# Patient Record
Sex: Male | Born: 1970
Health system: Southern US, Community
[De-identification: ages and names within clinical notes are randomized; demographics above are authoritative.]

## PROBLEM LIST (undated history)

## (undated) DIAGNOSIS — M169 Osteoarthritis of hip, unspecified: Secondary | ICD-10-CM

## (undated) DIAGNOSIS — M109 Gout, unspecified: Secondary | ICD-10-CM

## (undated) DIAGNOSIS — I10 Essential (primary) hypertension: Secondary | ICD-10-CM

## (undated) HISTORY — PX: JOINT REPLACEMENT: SHX530

## (undated) HISTORY — PX: TOTAL HIP ARTHROPLASTY: SHX124

---

## 1999-06-24 ENCOUNTER — Emergency Department (HOSPITAL_COMMUNITY): Admission: EM | Admit: 1999-06-24 | Discharge: 1999-06-24 | Payer: Self-pay | Admitting: Emergency Medicine

## 2000-11-08 ENCOUNTER — Emergency Department (HOSPITAL_COMMUNITY): Admission: EM | Admit: 2000-11-08 | Discharge: 2000-11-08 | Payer: Self-pay | Admitting: Emergency Medicine

## 2007-12-02 ENCOUNTER — Inpatient Hospital Stay (HOSPITAL_COMMUNITY): Admission: RE | Admit: 2007-12-02 | Discharge: 2007-12-06 | Payer: Self-pay | Admitting: Orthopedic Surgery

## 2007-12-03 ENCOUNTER — Encounter (INDEPENDENT_AMBULATORY_CARE_PROVIDER_SITE_OTHER): Payer: Self-pay | Admitting: Orthopedic Surgery

## 2007-12-03 ENCOUNTER — Ambulatory Visit: Payer: Self-pay | Admitting: Cardiology

## 2009-01-07 IMAGING — CR DG CHEST 1V PORT
1 series · 1 of 1 positions shown · non-contrast
Comparison: 12/04/07.

CLINICAL DATA: 36-year-old male with right hip aseptic necrosis.  
 PORTABLE CHEST ? 1 VIEW:

[view not recorded]
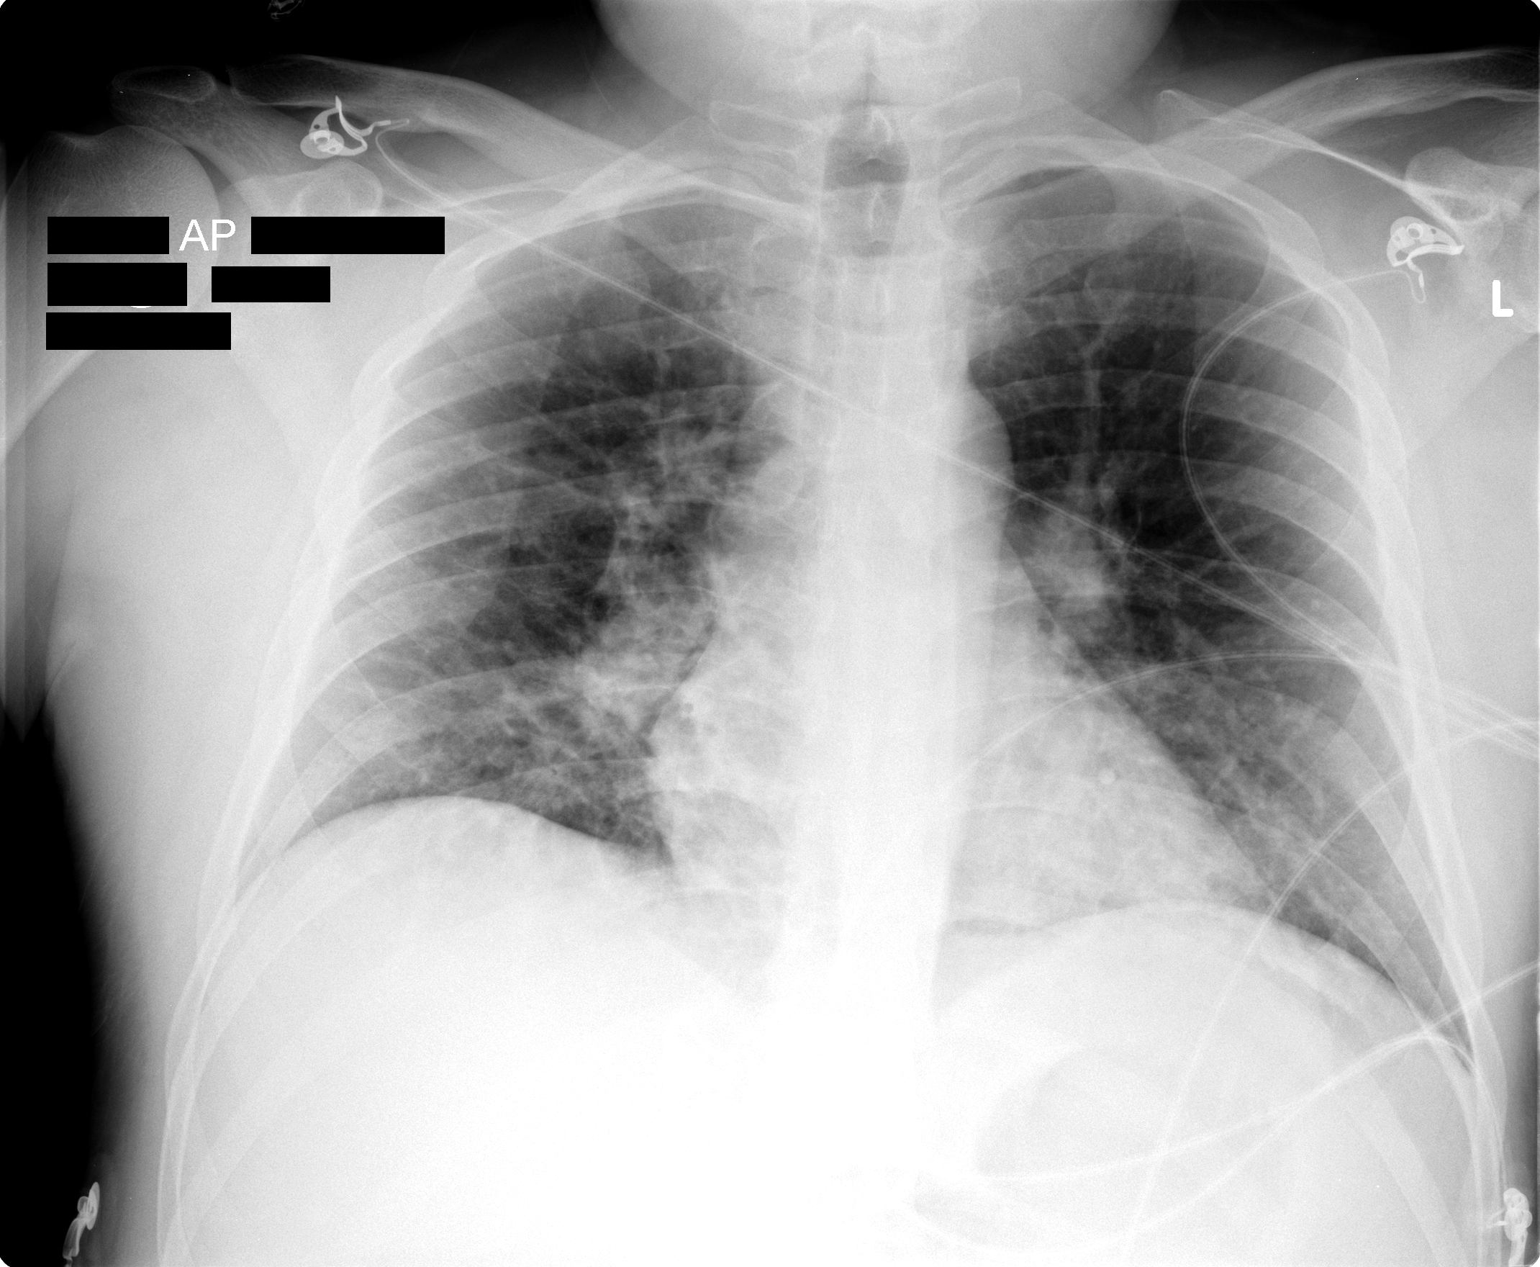

[1 of 1 positions shown; findings below may reference images not displayed]

FINDINGS: Perihilar and bibasilar airspace disease versus atelectasis has slightly increased.  No large effusion or pneumothorax.  The trachea is midline.  Normal heart size.
IMPRESSION: Increased perihilar and bibasilar atelectasis versus airspace disease.

## 2011-03-18 NOTE — Consult Note (Signed)
NAMEDELOS, KLICH                ACCOUNT NO.:  [id number]   MEDICAL RECORD NO.:  [id number]          PATIENT TYPE:  INP   LOCATION:  1608                         FACILITY:  [facility]   PHYSICIAN:  Beckey Rutter, MD  DATE OF BIRTH:  Aug 26, 1971   DATE OF CONSULTATION:  12/03/2007  DATE OF DISCHARGE:                                 CONSULTATION   REASON FOR CONSULTATION:  Tachycardia with difficulty breathing.   HISTORY OF PRESENT ILLNESS:  Mr. Solanki is a 40 year old , Caucasian  male with past medical history significant for cocaine abuse, ethanol  dependency and history of aseptic necrosis of bilateral hips, status  post right total hip replacement.  The patient had the surgery yesterday  with no complications.  This morning, as per the nurse's report, the  patient became tachycardic and they noticed that he dropped his oxygen  saturation to below 80.  That is when the rapid response team was called  and the patient was put on oxygen and then was stabilized by Ativan.  The patient himself could not give a good account in regards to what  happened this morning, but he stated that he was drinking hard liquor on  a daily basis with few exceptions and the last time he drank was Sunday.  That is three days before the surgery.  The patient drank one pint of  hard liquor.  He also admits to doing/snorting cocaine on an on-and-off  basis.  Otherwise, he denied injectable drug abuse or any other kind of  recreational drugs.   PAST MEDICAL HISTORY:  Not significant, other than the alcohol abuse and  the ethanol abuse.   SOCIAL HISTORY:  Patient lives with his wife.  He works as an  Personnel officer.   MEDICATIONS:  He has not taken any medication.   PAST SURGICAL HISTORY:  Patient had right AML total hip replacement  yesterday, January 29.   REVIEW OF SYSTEMS:  Twelve-point review of systems is not contributory.   EXAMINATION:  His pulse is 141, respiratory rate is 20, saturation on O2  documented earlier was 88.  Blood pressure is 105/64.  ON EXAM:  Head atraumatic, normocephalic.  EYES:  PERRL.  MOUTH:  Moist, no ulcer.  NECK:  Supple, no JVD.  PRECORDIUM EXAMINATION:  Fast heart rate with no added sounds.  LUNGS:  Bilateral fair air entry.  ABDOMEN:  Soft, nontender.  Bowel sounds present.  EXTREMITIES:  No obvious edema.  Patient is status post right-hip  surgery.  SKIN:  Patient has multiple tattoos on his skin.  NEUROLOGICALLY:  Patient is alert and oriented times three, moving all  his extremities spontaneously.   LABORATORY AND X-RAYS:  Cardiac panel showing troponin of 0.07, CK-MB 3  with relative index 0.9.  Sodium is 131, potassium 3.6, chloride is 98,  bicarb is 28, glucose is 123, BUN is 6, creatinine 1.08.  White blood  count 7.3, hemoglobin is 11.2, hematocrit is 31.5 and platelet count is  226.  Urine for drug screen, done yesterday before surgery, showing  positive for cocaine.   CT angiogram  ordered is still pending.  EKG showing sinus tachycardia  with ventricular rate of 139, normal axis, no significant ST/T-wave  change.   ASSESSMENT AND PLAN:  Patient is a 40 year old, now with some  irritability, likely alcohol withdrawal.  I am not sure of the time-  frame, because it seems like more than 36 hours now, but clinically, the  patient is most likely having withdrawal from alcohol symptoms with  impending or even delirium tremens at this time.  The patient could have  pulmonary embolism or acute coronary syndrome, but at this time, it  seems to be unlikely.   SUGGESTION:  1. We would probably need to start him on Ativan protocol and keep      monitoring him with CIWA.  2. Would rule out PE with CT angiogram, which was done.  The result is      pending, though I doubt it will yield anything at this time.  We      would follow through with the result of the CT angiogram.  3. I would also rule him out for acute coronary syndrome or any       cardiac activity with three cardiac enzymes and serial EKGs,      especially with the fact that the patient had history of chronic      cocaine abuse and we would be a little concerned that he could have      previous cardiac activity/attack previously.  I would request 2D      echo while he is in the hospital.   Thank you very much for the consultation.  [facility] Team H will follow  through with you.      Beckey Rutter, MD  Electronically Signed     EME/MEDQ  D:  12/03/2007  T:  12/03/2007  Job:  [id number]

## 2011-03-18 NOTE — Op Note (Signed)
John Singh, John Singh                ACCOUNT NO.:  [id number]   MEDICAL RECORD NO.:  [id number]          PATIENT TYPE:  INP   LOCATION:  0004                         FACILITY:  [facility]   PHYSICIAN:  John L. Rendall, M.D.  DATE OF BIRTH:  05-17-1971   DATE OF PROCEDURE:  DATE OF DISCHARGE:                               OPERATIVE REPORT   PREOPERATIVE DIAGNOSIS:  Aseptic necrosis, right hip.   POSTOPERATIVE DIAGNOSIS:  Aseptic necrosis, right hip.   SURGICAL PROCEDURES:  Right AML total hip replacement.   SURGEON:  Dr. Priscille Kluver.   ASSISTANT:  Arnoldo Morale, PA-C.   ANESTHESIA:  General.   PATHOLOGY:  The patient is a 40 year old, white male, weight of 93 kg  who admits to former heavy drinking over a long period of time. He has  on MRI aseptic necrosis of both femoral heads with collapse on the  right. This was confirmed at the time of surgery. He did not want any  type of vascularized graft and simply wanted surgical reconstruction of  the hip.   PROCEDURE:  Under general anesthesia, the patient is placed in the left  lateral decubitus position. The hip prepared with DuraPrep and draped as  a sterile field.  A posterior approach is made splitting the IT band in  the line of its fibers and inserting a short Charnley retractor.  The  hip is internally rotated and the short external rotators are released  with electrocautery from bone and the capsule is opened as well.  Electrocautery is used for multiple bleeding vessels. The hip capsule is  then opened in a T-shaped manner after teasing the short external  rotators off of it with a Cobb elevator.  The hip was then dislocated,  the superior femoral neck is exposed.  The IM initiator and canal finder  are used, the femoral neck is osteotomized and the canal is reamed up to  14-1/2 mm. This gives excellent chatter fit.  Rasping is then done 12.5  narrow, 13.5 narrow and 15 narrow which gives the best fit and calcar  reaming is then  done. With the femoral side completed the acetabulum was  then exposed as the OR team switches sides of the table. The cobra  retractors were placed inferiorly.  The hip capsule was teased off of  the labrum and two wing retractors were paid placed on the posterior  ilium above the acetabulum. With these in place,  the labrum is excised,  ligamentum teres is excised.  Progressive reaming is then done, 47, 49,  51, 53 and 55 mm which gives a good fit and circumferentially fills the  acetabulum.  Trial seating of a 44 bottoms out nicely, 46 has a  relatively snug fit in the acetabulum. With excellent bleeding bone all  about, the pinnacle 56 mm 300 series cup was inserted and appropriately  seated bottoming out nicely in the acetabulum.  Trials of a metal-on-  metal acetabular unit are then placed and components are then assembled  off a rasp for a 1-1/2 mm neck, 5 mm neck and 8 mm neck with  a 40 mm hip  ball.  This gave excellent fit, alignment and stability through normal  range of motion and leg lengths appeared to be equal as the patient was  in the lateral lying position compared to preop. With this all checked,  permanent components were obtained, the apex hole eliminator is  inserted.  The metal liner is placed in the acetabulum bottoming out  nicely. The 15 mm narrow AML stem is inserted and after trial one more  time with the +8 hip ball 40 mm excellent stability noted in internal  and external rotation, flexion extension and a shuck test of about a  centimeter. A permanent component was then attached to the femoral neck  and the hip was then reduced. Irrigation is done at each step of  insertion of permanent components. 2 grams of Kefzol were received  preop. As the hip was now in place, the remainder of the hip capsule was  reapproximated with #1 Tycron, the short external rotators were  reattached with #1 Tycron, the IT band closed with #1 Tycron, subcu with  #1 Vicryl, 2-0  Vicryl and skin clips.  Total operative time  approximately an hour.  The patient's blood loss under 300 mL. He  returned to recovery in good condition.      John L. Rendall, M.D.  Electronically Signed     JLR/MEDQ  D:  12/02/2007  T:  12/02/2007  Job:  [id number]

## 2011-03-21 NOTE — Discharge Summary (Signed)
John Singh, John Singh                ACCOUNT NO.:  [id number]   MEDICAL RECORD NO.:  [id number]          PATIENT TYPE:  INP   LOCATION:  1614                         FACILITY:  [facility]   PHYSICIAN:  John L. Rendall, M.D.  DATE OF BIRTH:  04-30-1971   DATE OF ADMISSION:  12/02/2007  DATE OF DISCHARGE:  12/06/2007                               DISCHARGE SUMMARY   ADMISSION DIAGNOSES:  1. Avascular necrosis, right hip.  2. History of alcohol abuse.  3. Episodic elevated blood pressure.  4. History of gout.   DISCHARGE DIAGNOSES:  1. Avascular necrosis right hip status post right total hip      arthroplasty.  2. Episodic drug use parentheses cocaine.  3. Acute alcohol withdrawal.  4. Possible bibasilar pneumonia.  5. Acute blood loss anemia secondary to surgery.  6. Elevated blood pressure.  7. History of gout.   SURGICAL PROCEDURES:  On December 02, 2007, John Singh underwent a right  total hip arthroplasty by Dr. Jonny Ruiz L.  Rendall assisted by Arnoldo Morale,  PA-C.  He had a pedicle 300 series acetabular cup size 56 mm placed with  an apex hole eliminator and a pedicle metal insert 40 mm inner diameter,  56 mm outer diameter.  An AML small stature size 15 femoral stem with an  articulate metal femoral head 40 mm +8.5 offset 12/14 taper.   COMPLICATIONS:  None.   CONSULTANT:  1. Medical consult was obtained December 03, 2007.  2. Physical therapy consult December 03, 2007.  3. Occupational therapy consult December 04, 2007  4. Case management [facility] consult obtained December 06, 2007.  5. Clinical social work consult was obtained December 06, 2007.   HISTORY OF PRESENT ILLNESS:  This 40 year old white male patient  presented to Dr. Priscille Kluver with a 93-month history of gradual onset  progressive right hip pain.  No history of any injury or surgery to the  hip but a history of heavy alcohol use.  He was found on x-ray to have  findings consistent with avascular necrosis of  the hip and was having  constant dull, throbbing sensation over the latter trochanter without  radiation.  Nothing was really aggravating or alleviating the pain, but  the hip did pop, grinding and difficulty putting on his socks and shoes.  He has failed conservative treatment and x-rays show avascular necrosis  of the hip.  Because of this, he is presenting for a right hip  replacement.   HOSPITAL COURSE:  John Singh has tolerated his surgical procedure well  without any immediate postoperative complications.  He was transferred  to the orthopedic floor.  On the morning of postop day #1, he became  tachycardiac and a bit on the hypertensive side.  A review of the chart  showed that a urine drug screen ordered preoperatively did show positive  for cocaine.  He did have a known history of heavy alcohol use in the  past.  Stat CT scan was obtained to rule out PE, echocardiogram, medical  consult and serial cardiac enzymes.  He  was continued on O2 and  transferred to step down unit.   On postop day #2, T-max was 101.6, heart rate 120, hemoglobin 9.4,  hematocrit 27.2.  Chest x-ray had shown no signs of PE but it had shown  signs of possible bibasilar pneumonia and possibly aspiration.  He was  started on IV antibiotics and continued on aggressive pulmonary toilet.   On postop day #3, he was feeling a bit better.  He had been started on  that day and protocol for withdrawal symptoms and first day after  surgery and he was doing well with that.  He did have some difficulty  with shakiness of hands.  His hemoglobin had dropped to 7.9 and 22.4.  He was transfused with one unit of packed red blood cells.  He was  continued on therapy per protocol.  He was able to be discharged off the  telemetry floor, weaned off his oxygen and actually was given 2 units of  blood cells that day.   On postop day #4, he was feeling better and tolerating diet well.  Pain  was controlled.  Hemoglobin 10,  hematocrit 28.4.  Pulse had gone down to  95, BP 93/59.  Leg was neurovascularly intact.  It was felt after being  seen by medicine that day that he would be ready for discharge home.  He  was to be switched to p.o. antibiotics by the medical doctors and was  discharged home later that day.   Social work had seen him and did recommend alcohol and drugs therapy.  Rehab therapy.   MEDICATIONS:  He may resume his home medication as follows:  1. Indocin 25 mg, he is not to take at this time.  2. Celebrex 200 mg one tablet p.o. b.i.d..   Additional medications at this time include:  1. Arixtra 2.5 mg subcu q. 8 p.m. last dose on February 7.  He is to      resume his aspirin on February 8.  2. Percocet 5/325 1-2 tablets p.o. q.4 h p.r.n. for pain.  He is to      take no alcohol or drugs with the above-mentioned medicines.  3. Ativan 2 mg p.o. q.8 h p.r.n. anxiety.  4. Ambien 10 mg p.o. q.h.s. p.r.n.  5. Thiamine 100 mg p.o. q.a.m.  6. Folic acid 1 mg p.o. q.a.m.  7. Multivitamin one tablet p.o. q.a.m.Marland Kitchen   DISCHARGE INSTRUCTIONS:  1. Diet:  He is to resume his regular prehospitalization diet.  2. Activity:  He is to be out of bed weightbearing as tolerated on the      right leg with use of walker.  He is to not lift or drive for 6      weeks and he is to increase activity slowly.  Please see the white      total hip discharge sheet for further activity instructions.  He      may shower on Wednesday.  3. Wound care:  He he is to keep the dressing on until Thursday and      then change daily with 4x4s and tape.  Please see the white total      hip discharge sheet for further wound care instructions.  4. Follow-up:  He is to follow up with Dr. Priscille Kluver in our office next      Thursday or Friday.  Needs to call [phone] for that appointment.      He is to follow up about February 10.  LABORATORY DATA:  Hemoglobin/hematocrit have ranged from 15.4 and 44.4  on the January 27, to 7.9 and 22.4  on February 1, to 10 and 28.4 on  February 2.  White count ranged from 5 on January 27 to 11.9 on January  31, to 7.2 on February 2.  Platelets 296 on January 27 to 181 on  February 1.   Sodium dropped to a low of 131 on January 30.  Glucose ranged from 104  on January 27  to 150 on February 1 to 122 on February 2.  BUN and  creatinine went to a low of 4 and 1.03 on February 2.  Calcium ranged  from 9.5 on January 27 to 7.9 on January  30 to 8.3 on February 2.  His  CK on January 30 was 340 with troponin 0.07.  CK then went to a high of  419 on January 30 with a CK-MB of 4.2 with troponin dropped down to the  normal range.  CK then went 248 on February 2.  Urine drug screen done  on January 29 was positive for cocaine, negative for all other classes  tested.   Chest CT with and without contrast done on January 30 showed no evidence  of PE but bibasilar air space opacities suggesting aspiration  pneumonitis could include infection.  There is a moderate amount of  fluid in the stomach and fluid in the esophagus.  Chest x-ray done on  January  31 showed medial bilateral basilar airspace disease suggesting  aspiration without significant interval change.  Chest x-ray on the  February 1 showed increased perihilar and bibasilar atelectasis versus  airspace disease.  All other laboratory studies were within normal  limits.      Legrand Pitts Duffy, P.A.      John L. Rendall, M.D.  Electronically Signed    KED/MEDQ  D:  12/17/2007  T:  12/19/2007  Job:  [id number]   cc:   Jonny Ruiz L. Rendall, M.D.  Fax: [phone]   Gloriajean Dell. Andrey Campanile, M.D.  Fax: [phone]

## 2011-07-25 LAB — DIFFERENTIAL
Eosinophils Relative: 2
Lymphocytes Relative: 32
Lymphs Abs: 1.6
Monocytes Relative: 11

## 2011-07-25 LAB — CBC
HCT: 31.5 — ABNORMAL LOW
HCT: 28.4 — ABNORMAL LOW
Hemoglobin: 10 — ABNORMAL LOW
MCHC: 34.1
MCHC: 34.8
MCHC: 35.1
MCV: 90.1
MCV: 90.5
MCV: 90.9
MCV: 89
Platelets: 296
Platelets: 181
RBC: 3.04 — ABNORMAL LOW
RBC: 3.49 — ABNORMAL LOW
RBC: 2.47 — ABNORMAL LOW
RDW: 12.9
RDW: 13.7
RDW: 14.1
WBC: 7.3
WBC: 8.5

## 2011-07-25 LAB — BASIC METABOLIC PANEL
BUN: 6
CO2: 28
Calcium: 8.1 — ABNORMAL LOW
Calcium: 8.3 — ABNORMAL LOW
Chloride: 98
GFR calc Af Amer: >60
GFR calc non Af Amer: >60
GFR calc non Af Amer: >60
GFR calc non Af Amer: >60
Glucose, Bld: 134 — ABNORMAL HIGH
Glucose, Bld: 122 — ABNORMAL HIGH
Potassium: 3.9
Potassium: 3.9
Sodium: 131 — ABNORMAL LOW
Sodium: 135
Sodium: 136
Sodium: 139

## 2011-07-25 LAB — CARDIAC PANEL(CRET KIN+CKTOT+MB+TROPI)
CK, MB: 2
CK, MB: 2.2
CK, MB: 3
CK, MB: 3.8
CK, MB: 4.2 — ABNORMAL HIGH
CK, MB: 1.2
CK, MB: 1.5
Relative Index: 0.7
Relative Index: 1
Relative Index: 0.5
Relative Index: 0.6
Total CK: 301 — ABNORMAL HIGH
Total CK: 309 — ABNORMAL HIGH
Total CK: 340 — ABNORMAL HIGH
Total CK: 419 — ABNORMAL HIGH
Total CK: 206

## 2011-07-25 LAB — CROSSMATCH
ABO/RH(D): O POS
ABO/RH(D): O POS
Antibody Screen: NEGATIVE

## 2011-07-25 LAB — RAPID URINE DRUG SCREEN, HOSP PERFORMED: Cocaine: POSITIVE — AB

## 2011-07-25 LAB — COMPREHENSIVE METABOLIC PANEL
ALT: 18
AST: 22
Albumin: 3.8
Calcium: 9.5
Potassium: 4
Sodium: 139
Total Protein: 7.2

## 2011-07-25 LAB — URINALYSIS, ROUTINE W REFLEX MICROSCOPIC
Bilirubin Urine: NEGATIVE
Hgb urine dipstick: NEGATIVE
Nitrite: NEGATIVE
Protein, ur: NEGATIVE
Urobilinogen, UA: 0.2

## 2011-07-25 LAB — HEMOGLOBIN AND HEMATOCRIT, BLOOD: Hemoglobin: 8 — ABNORMAL LOW

## 2011-07-25 LAB — PROTIME-INR: Prothrombin Time: 12.8

## 2013-08-29 ENCOUNTER — Ambulatory Visit (INDEPENDENT_AMBULATORY_CARE_PROVIDER_SITE_OTHER): Payer: Medicare HMO | Admitting: Psychology

## 2013-08-29 DIAGNOSIS — F1010 Alcohol abuse, uncomplicated: Secondary | ICD-10-CM

## 2014-02-14 ENCOUNTER — Encounter (HOSPITAL_BASED_OUTPATIENT_CLINIC_OR_DEPARTMENT_OTHER): Payer: Self-pay | Admitting: Emergency Medicine

## 2014-02-14 ENCOUNTER — Emergency Department (HOSPITAL_BASED_OUTPATIENT_CLINIC_OR_DEPARTMENT_OTHER)
Admission: EM | Admit: 2014-02-14 | Discharge: 2014-02-14 | Disposition: A | Discharge status: Home / Self Care | Payer: Medicare HMO | Attending: Emergency Medicine | Admitting: Emergency Medicine

## 2014-02-14 ENCOUNTER — Emergency Department (HOSPITAL_BASED_OUTPATIENT_CLINIC_OR_DEPARTMENT_OTHER): Payer: Medicare HMO

## 2014-02-14 DIAGNOSIS — M25469 Effusion, unspecified knee: Secondary | ICD-10-CM | POA: Insufficient documentation

## 2014-02-14 DIAGNOSIS — M25552 Pain in left hip: Secondary | ICD-10-CM | POA: Diagnosis present

## 2014-02-14 DIAGNOSIS — M109 Gout, unspecified: Secondary | ICD-10-CM | POA: Insufficient documentation

## 2014-02-14 DIAGNOSIS — M25562 Pain in left knee: Secondary | ICD-10-CM | POA: Diagnosis present

## 2014-02-14 DIAGNOSIS — M25569 Pain in unspecified knee: Secondary | ICD-10-CM | POA: Insufficient documentation

## 2014-02-14 DIAGNOSIS — I10 Essential (primary) hypertension: Secondary | ICD-10-CM | POA: Insufficient documentation

## 2014-02-14 DIAGNOSIS — Z96649 Presence of unspecified artificial hip joint: Secondary | ICD-10-CM | POA: Insufficient documentation

## 2014-02-14 DIAGNOSIS — M25559 Pain in unspecified hip: Secondary | ICD-10-CM | POA: Insufficient documentation

## 2014-02-14 DIAGNOSIS — R609 Edema, unspecified: Secondary | ICD-10-CM

## 2014-02-14 HISTORY — DX: Gout, unspecified: M10.9

## 2014-02-14 HISTORY — DX: Essential (primary) hypertension: I10

## 2014-02-14 HISTORY — DX: Osteoarthritis of hip, unspecified: M16.9

## 2014-02-14 LAB — SYNOVIAL CELL COUNT + DIFF, W/ CRYSTALS
CRYSTALS FLUID: NONE SEEN
Eosinophils-Synovial: 0 % (ref 0–1)
Lymphocytes-Synovial Fld: 3 % (ref 0–20)
Monocyte-Macrophage-Synovial Fluid: 12 % — ABNORMAL LOW (ref 50–90)
NEUTROPHIL, SYNOVIAL: 85 % — AB (ref 0–25)
Other Cells-SYN: 0
WBC, SYNOVIAL: 4911 /mm3 — AB (ref 0–200)

## 2014-02-14 MED ORDER — OXYCODONE-ACETAMINOPHEN 5-325 MG PO TABS
2.0000 | ORAL_TABLET | Freq: Once | ORAL | Status: AC
  Administered 2014-02-14: 2 via ORAL
  Filled 2014-02-14: qty 2

## 2014-02-14 MED ORDER — OXYCODONE-ACETAMINOPHEN 5-325 MG PO TABS: 1.0000 | ORAL_TABLET | Freq: Four times a day (QID) | ORAL | Status: DC | PRN

## 2014-02-14 NOTE — Discharge Instructions (Signed)
Arthralgia  Arthralgia is joint pain. A joint is a place where two bones meet. Joint pain can happen for many reasons. The joint can be bruised, stiff, infected, or weak from aging. Pain usually goes away after resting and taking medicine for soreness.   HOME CARE  · Rest the joint as told by your doctor.  · Keep the sore joint raised (elevated) for the first 24 hours.  · Put ice on the joint area.  · Put ice in a plastic bag.  · Place a towel between your skin and the bag.  · Leave the ice on for 15-20 minutes, 03-04 times a day.  · Wear your splint, casting, elastic bandage, or sling as told by your doctor.  · Only take medicine as told by your doctor. Do not take aspirin.  · Use crutches as told by your doctor. Do not put weight on the joint until told to by your doctor.  GET HELP RIGHT AWAY IF:   · You have bruising, puffiness (swelling), or more pain.  · Your fingers or toes turn blue or start to lose feeling (numb).  · Your medicine does not lessen the pain.  · Your pain becomes severe.  · You have a temperature by mouth above 102° F (38.9° C), not controlled by medicine.  · You cannot move or use the joint.  MAKE SURE YOU:   · Understand these instructions.  · Will watch your condition.  · Will get help right away if you are not doing well or get worse.  Document Released: 10/08/2009 Document Revised: 01/12/2012 Document Reviewed: 10/08/2009  [employer]® Patient Information ©2014 [employer].

## 2014-02-14 NOTE — ED Provider Notes (Signed)
CSN: [id number]     Arrival date & time 02/14/14  1019 History   First MD Initiated Contact with Patient 02/14/14 1122     Chief Complaint  Patient presents with  . Knee Pain    left knee and hip pain     (Consider location/radiation/quality/duration/timing/severity/associated sxs/prior Treatment) Patient is a 43 y.o. male presenting with knee pain. The history is provided by the patient.  Knee Pain Location:  Knee Time since incident:  1 week Injury: no   Knee location:  L knee Pain details:    Quality:  Aching   Radiates to:  Does not radiate   Severity:  Moderate   Onset quality:  Gradual   Duration:  1 week   Timing:  Constant   Progression:  Worsening Chronicity:  Recurrent Dislocation: no   Foreign body present:  No foreign bodies Prior injury to area:  No Relieved by:  Nothing Worsened by:  Nothing tried Ineffective treatments:  None tried Associated symptoms: no fever and no neck pain     Past Medical History  Diagnosis Date  . Gout   . Degenerative joint disease (DJD) of hip   . Hypertension    Past Surgical History  Procedure Laterality Date  . Total hip arthroplasty     No family history on file. History  Substance Use Topics  . Smoking status: Never Smoker   . Smokeless tobacco: Never Used  . Alcohol Use: Yes     Comment: occassional    Review of Systems  Constitutional: Negative for fever.  HENT: Negative for drooling and rhinorrhea.   Eyes: Negative for pain.  Respiratory: Negative for cough and shortness of breath.   Cardiovascular: Negative for chest pain and leg swelling.  Gastrointestinal: Negative for nausea, vomiting, abdominal pain and diarrhea.  Genitourinary: Negative for dysuria and hematuria.  Musculoskeletal: Negative for gait problem and neck pain.  Skin: Negative for color change.  Neurological: Negative for numbness and headaches.  Hematological: Negative for adenopathy.  Psychiatric/Behavioral: Negative for behavioral  problems.  All other systems reviewed and are negative.     Allergies  Review of patient's allergies indicates no known allergies.  Home Medications   Prior to Admission medications   Medication Sig Start Date End Date Taking? Authorizing Provider  ibuprofen (ADVIL,MOTRIN) 800 MG tablet Take 800 mg by mouth every 8 (eight) hours as needed.   Yes Historical Provider, MD   BP 133/89  Pulse 80  Temp(Src) 98.5 F (36.9 C) (Oral)  Resp 16  Ht 6\' 1"  (1.854 m)  Wt 208 lb (94.348 kg)  BMI 27.45 kg/m2  SpO2 98% Physical Exam  Nursing note and vitals reviewed. Constitutional: He is oriented to person, place, and time. He appears well-developed and well-nourished.  HENT:  Head: Normocephalic and atraumatic.  Right Ear: External ear normal.  Left Ear: External ear normal.  Nose: Nose normal.  Mouth/Throat: Oropharynx is clear and moist. No oropharyngeal exudate.  Eyes: Conjunctivae and EOM are normal. Pupils are equal, round, and reactive to light.  Neck: Normal range of motion. Neck supple.  Cardiovascular: Normal rate, regular rhythm, normal heart sounds and intact distal pulses.  Exam reveals no gallop and no friction rub.   No murmur heard. Pulmonary/Chest: Effort normal and breath sounds normal. No respiratory distress. He has no wheezes.  Abdominal: Soft. Bowel sounds are normal. He exhibits no distension. There is no tenderness. There is no rebound and no guarding.  Musculoskeletal: He exhibits no edema.  Mild  to moderate effusion in left knee.  Mild diffuse tenderness to palpation of the left knee.  Normal appearance of LLE w/out cutaneous findings.  Mildly limited range of motion in the left knee do to pain.  Normal appearance of left hip without focal tenderness to palpation.  Mild reproduction of hip pain with flexion and extension of the left hip.  2+ distal pulses in lower extremities.   Neurological: He is alert and oriented to person, place, and time.  Skin:  Skin is warm and dry.  Psychiatric: He has a normal mood and affect. His behavior is normal.    ED Course  ARTHOCENTESIS Date/Time: 02/14/2014 12:36 PM Performed by: Purvis SheffieldHARRISON, Jaynee Winters, S Authorized by: Purvis SheffieldHARRISON, Jekhi Bolin, S Consent: Verbal consent obtained. written consent obtained. Risks and benefits: risks, benefits and alternatives were discussed Consent given by: patient Patient understanding: patient states understanding of the procedure being performed Patient consent: the patient's understanding of the procedure matches consent given Procedure consent: procedure consent matches procedure scheduled Relevant documents: relevant documents present and verified Test results: test results available and properly labeled Site marked: the operative site was marked Imaging studies: imaging studies available Required items: required blood products, implants, devices, and special equipment available Patient identity confirmed: verbally with patient, arm band, provided demographic data and hospital-assigned identification number Time out: Immediately prior to procedure a "time out" was called to verify the correct patient, procedure, equipment, support staff and site/side marked as required. Indications: joint swelling,  pain and diagnostic evaluation  Body area: knee Joint: left knee Local anesthesia used: yes Anesthesia: local infiltration Local anesthetic: lidocaine 1% without epinephrine Patient sedated: no Preparation: Patient was prepped and draped in the usual sterile fashion. Needle gauge: 18 G Ultrasound guidance: no Approach: superior medial. Aspirate characteristics: clear yellow. Aspirate amount: 15 ml   (including critical care time) Labs Review Labs Reviewed  SYNOVIAL CELL COUNT + DIFF, W/ CRYSTALS - Abnormal; Notable for the following:    Appearance-Synovial HAZY (*)    WBC, Synovial 4911 (*)    Neutrophil, Synovial 85 (*)    Monocyte-Macrophage-Synovial Fluid 12 (*)     All other components within normal limits  BODY FLUID CULTURE    Imaging Review Dg Hip Complete Left  02/14/2014   CLINICAL DATA:  Lateral hip pain, no injury  EXAM: LEFT HIP - COMPLETE 2+ VIEW  COMPARISON:  06/21/2007  FINDINGS: Right hip prosthesis.  Left hip joint space preserved.  Osseous mineralization normal.  Osseous pelvis unremarkable.  Small pelvic phleboliths.  No acute fracture, dislocation, or bone destruction.  IMPRESSION: No acute osseous abnormalities.   Electronically Signed   By: Ulyses SouthwardMark  Boles M.D.   On: 02/14/2014 12:16   Dg Knee 1-2 Views Left  02/14/2014   CLINICAL DATA:  Knee pain, swelling, unable to fully extend, no injury  EXAM: LEFT KNEE - 1-2 VIEW  COMPARISON:  01/06/2012  FINDINGS: Osseous mineralization normal.  Minimal medial compartment joint space narrowing.  Suspected joint effusion.  No acute fracture, dislocation or bone destruction.  IMPRESSION: Minimal degenerative changes left knee with probable knee joint effusion.   Electronically Signed   By: Ulyses SouthwardMark  Boles M.D.   On: 02/14/2014 12:16     EKG Interpretation None      MDM   Final diagnoses:  Left knee pain  Left hip pain    11:53 AM 43 y.o. male  With a history of gout and degenerative joint disease status post right hip replacement who presents with left knee  and left hip pain. He notes that the left knee pain began approximately one week ago and has continued to worsen. He notes swelling of the knee but no cutaneous changes. He denies any other associated symptoms including fever, vomiting, diarrhea. He also notes some mild left hip pain that began yesterday evening which has persisted. He is afebrile and vital signs are unremarkable here. Will get screening imaging. He states that he recently joined a gym and has been more active than normal. I suspect his knee effusion is related to gout versus osteoarthritis associated w/ his increased activity. I have a low suspicion for a septic joint. He has had  previous effusions w/ arthrocentesis in his knee before. Will get screening imaging. Will perform arthrocentesis.   1:39 PM: Pt tolerated arthrocentesis well. As I have a low suspicion for septic arthritis the pt will be d/c and I will call him w/ the results. I suspect his hip pain is likely from an antalgic gait d/t limping w/ left knee pain. I have discussed the diagnosis/risks/treatment options with the patient and family and believe the pt to be eligible for discharge home to follow-up with pcp in 2-3 days. We also discussed returning to the ED immediately if new or worsening sx occur. We discussed the sx which are most concerning (e.g., worsening pain, redness, inc swelling, fever) that necessitate immediate return. Medications administered to the patient during their visit and any new prescriptions provided to the patient are listed below.  8:41 PM Gram stain neg. WBC <5000. No crystals. I suspect his pain/effusion are related to osteoarthritis from inc use at the gym recently.  I called him and discussed this, he understands.   Medications given during this visit Medications  oxyCODONE-acetaminophen (PERCOCET/ROXICET) 5-325 MG per tablet 2 tablet (2 tablets Oral Given 02/14/14 1208)    Discharge Medication List as of 02/14/2014  1:40 PM    START taking these medications   Details  oxyCODONE-acetaminophen (PERCOCET) 5-325 MG per tablet Take 1-2 tablets by mouth every 6 (six) hours as needed for moderate pain., Starting 02/14/2014, Until Discontinued, Print         Junius Argyle, MD 02/14/14 2045

## 2014-02-14 NOTE — ED Notes (Signed)
Patient states he has had pain and swelling in his left knee for the last week.  States he had pain but was able to continue to work and get around.  States this morning he woke up with increased pain, warmness to touch, and pain in his left hip.  States he has a history of gout, DJD of his bilateral hips.  S/P right total hip replacement.

## 2014-02-17 LAB — BODY FLUID CULTURE
CULTURE: NO GROWTH
Gram Stain: NONE SEEN

## 2016-01-16 ENCOUNTER — Ambulatory Visit (INDEPENDENT_AMBULATORY_CARE_PROVIDER_SITE_OTHER): Payer: Managed Care, Other (non HMO) | Admitting: Physician Assistant

## 2016-01-16 VITALS — BP 142/94 | HR 100 | Temp 99.9°F | Resp 14 | Ht 73.0 in | Wt 218.0 lb

## 2016-01-16 DIAGNOSIS — R05 Cough: Secondary | ICD-10-CM

## 2016-01-16 DIAGNOSIS — R059 Cough, unspecified: Secondary | ICD-10-CM

## 2016-01-16 DIAGNOSIS — R509 Fever, unspecified: Secondary | ICD-10-CM

## 2016-01-16 LAB — POCT INFLUENZA A/B
INFLUENZA B, POC: NEGATIVE
Influenza A, POC: NEGATIVE

## 2016-01-16 MED ORDER — PSEUDOEPHEDRINE-GUAIFENESIN ER 120-1200 MG PO TB12: 1.0000 | ORAL_TABLET | Freq: Two times a day (BID) | ORAL | Status: DC

## 2016-01-16 MED ORDER — BENZONATATE 100 MG PO CAPS: 100.0000 mg | ORAL_CAPSULE | Freq: Three times a day (TID) | ORAL | Status: DC | PRN

## 2016-01-16 NOTE — Patient Instructions (Addendum)
Your flu swab was negative.  I think your symptoms are most likely from a viral illness. Taking mucinex-d twice daily for the next 3-4 days. This will help with the congestion.  Taking the tessalon every 8 hours will help with the cough. Tylenol every 4-6 hours for body aches. If you're not feeling better in 3-4 days please let us know.

## 2016-01-16 NOTE — Progress Notes (Signed)
   Subjective:    Patient ID: Jones BalesJamie Novicki, male    DOB: [birth year], 45 y.o.   MRN: [id number]  Chief Complaint  Patient presents with  . Headache  . Sore Throat  . Cough    chest pain when coughing, non productive  . Fever   Medications, allergies, past medical history, surgical history, family history, social history and problem list reviewed and updated.  HPI  [age] yom presents with above complaints.   Symptoms originally started 2 wks ago with head/nasal congestion, mild non prod cough, and fatigue. Fairly persistent past 2 weeks. Last night felt suddenly worse. Shaking chills last night. Did not check temp. Temp 99.9 here after taking tylenol this am. Body aches last night and today.   Head congestion, mild cough improving but still there. Did not get flu vaccine this year. Mild headache yest and today. Now improving. No assoc vision changes. No neck stiffness.   Review of Systems No cp, sob, abd pain, n/v, diarrhea.     Objective:   Physical Exam  Constitutional: He appears well-developed and well-nourished.  Non-toxic appearance. He does not have a sickly appearance. He does not appear ill. No distress.  BP 142/94 mmHg  Pulse 100  Temp(Src) 99.9 F (37.7 C) (Oral)  Resp 14  Ht 6\' 1"  (1.854 m)  Wt 218 lb (98.884 kg)  BMI 28.77 kg/m2  SpO2 95%   HENT:  Right Ear: Tympanic membrane normal.  Left Ear: Tympanic membrane normal.  Nose: Nose normal. Right sinus exhibits no maxillary sinus tenderness and no frontal sinus tenderness. Left sinus exhibits no maxillary sinus tenderness and no frontal sinus tenderness.  Mouth/Throat: Uvula is midline, oropharynx is clear and moist and mucous membranes are normal.  Neck: No Brudzinski's sign noted.  Pulmonary/Chest: Effort normal and breath sounds normal.  Lymphadenopathy:       Head (right side): No submental, no submandibular and no tonsillar adenopathy present.       Head (left side): No submental, no submandibular and no  tonsillar adenopathy present.    He has no cervical adenopathy.   Results for orders placed or performed in visit on 01/16/16  POCT Influenza A/B  Result Value Ref Range   Influenza A, POC Negative Negative   Influenza B, POC Negative Negative      Assessment & Plan:   Fever and chills - Plan: POCT Influenza A/B, Pseudoephedrine-Guaifenesin (MUCINEX D) [phone] MG TB12  Cough - Plan: benzonatate (TESSALON) 100 MG capsule --flu swab negative --suspect viral uri with relatively benign exam --mucinex-d, tessalon, tylenol, rest, fluids --rtc 3-4 days if not feeling better  Donnajean Lopesodd M. Melvinia Ashby, PA-C Physician Assistant-Certified [facility]  01/16/2016 8:39 PM

## 2016-09-27 ENCOUNTER — Encounter (HOSPITAL_BASED_OUTPATIENT_CLINIC_OR_DEPARTMENT_OTHER): Payer: Self-pay | Admitting: *Deleted

## 2016-09-27 ENCOUNTER — Emergency Department (HOSPITAL_BASED_OUTPATIENT_CLINIC_OR_DEPARTMENT_OTHER): Payer: Managed Care, Other (non HMO)

## 2016-09-27 ENCOUNTER — Emergency Department (HOSPITAL_BASED_OUTPATIENT_CLINIC_OR_DEPARTMENT_OTHER)
Admission: EM | Admit: 2016-09-27 | Discharge: 2016-09-27 | Disposition: A | Discharge status: Home / Self Care | Payer: Managed Care, Other (non HMO) | Attending: Emergency Medicine | Admitting: Emergency Medicine

## 2016-09-27 DIAGNOSIS — Z79899 Other long term (current) drug therapy: Secondary | ICD-10-CM | POA: Insufficient documentation

## 2016-09-27 DIAGNOSIS — I10 Essential (primary) hypertension: Secondary | ICD-10-CM | POA: Insufficient documentation

## 2016-09-27 DIAGNOSIS — R109 Unspecified abdominal pain: Secondary | ICD-10-CM | POA: Diagnosis present

## 2016-09-27 DIAGNOSIS — K529 Noninfective gastroenteritis and colitis, unspecified: Secondary | ICD-10-CM | POA: Diagnosis not present

## 2016-09-27 DIAGNOSIS — K639 Disease of intestine, unspecified: Secondary | ICD-10-CM

## 2016-09-27 LAB — COMPREHENSIVE METABOLIC PANEL
ALK PHOS: 69 U/L (ref 38–126)
ALT: 35 U/L (ref 17–63)
ANION GAP: 10 (ref 5–15)
AST: 28 U/L (ref 15–41)
Albumin: 3.8 g/dL (ref 3.5–5.0)
BUN: 8 mg/dL (ref 6–20)
CALCIUM: 9.3 mg/dL (ref 8.9–10.3)
CO2: 27 mmol/L (ref 22–32)
Chloride: 100 mmol/L — ABNORMAL LOW (ref 101–111)
Creatinine, Ser: 0.98 mg/dL (ref 0.61–1.24)
GFR calc non Af Amer: >60 mL/min (ref >60)
Glucose, Bld: 122 mg/dL — ABNORMAL HIGH (ref 65–99)
POTASSIUM: 3.4 mmol/L — AB (ref 3.5–5.1)
SODIUM: 137 mmol/L (ref 135–145)
Total Bilirubin: 0.7 mg/dL (ref 0.3–1.2)
Total Protein: 7.6 g/dL (ref 6.5–8.1)

## 2016-09-27 LAB — URINALYSIS, ROUTINE W REFLEX MICROSCOPIC
Glucose, UA: NEGATIVE mg/dL
HGB URINE DIPSTICK: NEGATIVE
KETONES UR: 15 mg/dL — AB
Leukocytes, UA: NEGATIVE
Nitrite: NEGATIVE
PROTEIN: NEGATIVE mg/dL
SPECIFIC GRAVITY, URINE: 1.022 (ref 1.005–1.030)
pH: 5.5 (ref 5.0–8.0)

## 2016-09-27 LAB — CBC WITH DIFFERENTIAL/PLATELET
BASOS PCT: 0 %
Basophils Absolute: 0 10*3/uL (ref 0.0–0.1)
EOS ABS: 0 10*3/uL (ref 0.0–0.7)
Eosinophils Relative: 0 %
HCT: 45.7 % (ref 39.0–52.0)
HEMOGLOBIN: 16.5 g/dL (ref 13.0–17.0)
LYMPHS ABS: 1.1 10*3/uL (ref 0.7–4.0)
Lymphocytes Relative: 10 %
MCH: 33.5 pg (ref 26.0–34.0)
MCHC: 36.1 g/dL — ABNORMAL HIGH (ref 30.0–36.0)
MCV: 92.9 fL (ref 78.0–100.0)
Monocytes Absolute: 1.3 10*3/uL — ABNORMAL HIGH (ref 0.1–1.0)
Monocytes Relative: 13 %
NEUTROS ABS: 7.7 10*3/uL (ref 1.7–7.7)
NEUTROS PCT: 77 %
Platelets: 269 10*3/uL (ref 150–400)
RBC: 4.92 MIL/uL (ref 4.22–5.81)
RDW: 13 % (ref 11.5–15.5)
WBC: 10.1 10*3/uL (ref 4.0–10.5)

## 2016-09-27 LAB — OCCULT BLOOD X 1 CARD TO LAB, STOOL: FECAL OCCULT BLD: POSITIVE — AB

## 2016-09-27 LAB — LIPASE, BLOOD: Lipase: 16 U/L (ref 11–51)

## 2016-09-27 MED ORDER — SODIUM CHLORIDE 0.9 % IV SOLN: 1000.0000 mL | INTRAVENOUS | Status: DC

## 2016-09-27 MED ORDER — METRONIDAZOLE 500 MG PO TABS: 500.0000 mg | ORAL_TABLET | Freq: Two times a day (BID) | ORAL | 0 refills | Status: DC

## 2016-09-27 MED ORDER — CIPROFLOXACIN HCL 500 MG PO TABS: 500.0000 mg | ORAL_TABLET | Freq: Two times a day (BID) | ORAL | 0 refills | Status: DC

## 2016-09-27 MED ORDER — IOPAMIDOL (ISOVUE-300) INJECTION 61%
100.0000 mL | Freq: Once | INTRAVENOUS | Status: AC | PRN
  Administered 2016-09-27: 100 mL via INTRAVENOUS

## 2016-09-27 MED ORDER — DOCUSATE SODIUM 100 MG PO CAPS: 100.0000 mg | ORAL_CAPSULE | Freq: Two times a day (BID) | ORAL | 0 refills | Status: DC

## 2016-09-27 MED ORDER — SODIUM CHLORIDE 0.9 % IV SOLN
1000.0000 mL | Freq: Once | INTRAVENOUS | Status: AC
  Administered 2016-09-27: 1000 mL via INTRAVENOUS

## 2016-09-27 NOTE — ED Triage Notes (Signed)
Patient states he has a one week history of constipation and had used OTC meds with no relief.  Now has diffuse abdominal pain, which is associated with nausea..  Was recently treated for gout in his left knee, and started on colchiceine and codeine.

## 2016-09-27 NOTE — ED Provider Notes (Signed)
[facility] Provider Note   CSN: [id number] Arrival date & time: 09/27/16  1257  By signing my name below, I, Modena Jansky, attest that this documentation has been prepared under the direction and in the presence of Arby Barrette, MD . Electronically Signed: Modena Jansky, Scribe. 09/27/2016. 3:27 PM.  History   Chief Complaint Chief Complaint  Patient presents with  . Abdominal Pain   The history is provided by the patient. No language interpreter was used.   HPI Comments: John Singh is a 45 y.o. male who presents to the Emergency Department complaining of constant constipation that started about 9 days ago. He states that he has not had a BM, and he has been taking an OTC stool softener and Dulcolax without any relief. He reports associated symptoms of mild abdominal pain, mild rectal pain, decreased appetite,  nausea, chills, pallor, sleep disturbance, and fatgue. He admits to a hx of a hip replacement due to DJD. He denies any prior hx of constipation, rectal bleeding, dysuria, vomiting, cough, SOB, chest pain, or other complaints.   Past Medical History:  Diagnosis Date  . Degenerative joint disease (DJD) of hip   . Gout   . Hypertension     Patient Active Problem List   Diagnosis Date Noted  . Left knee pain 02/14/2014  . Left hip pain 02/14/2014    Past Surgical History:  Procedure Laterality Date  . JOINT REPLACEMENT    . TOTAL HIP ARTHROPLASTY         Home Medications    Prior to Admission medications   Medication Sig Start Date End Date Taking? Authorizing Provider  CODEINE SULFATE PO Take by mouth.   Yes Historical Provider, MD  colchicine 0.6 MG tablet Take 0.6 mg by mouth daily.   Yes Historical Provider, MD  benzonatate (TESSALON) 100 MG capsule Take 1-2 capsules (100-200 mg total) by mouth 3 (three) times daily as needed for cough. 01/16/16   Raelyn Ensign, PA  ciprofloxacin (CIPRO) 500 MG tablet Take 1 tablet (500 mg total) by mouth 2  (two) times daily. One po bid x 7 days 09/27/16   Arby Barrette, MD  docusate sodium (COLACE) 100 MG capsule Take 1 capsule (100 mg total) by mouth every 12 (twelve) hours. 09/27/16   Arby Barrette, MD  ibuprofen (ADVIL,MOTRIN) 800 MG tablet Take 800 mg by mouth every 8 (eight) hours as needed. Reported on 01/16/2016    Historical Provider, MD  metroNIDAZOLE (FLAGYL) 500 MG tablet Take 1 tablet (500 mg total) by mouth 2 (two) times daily. One po bid x 7 days 09/27/16   Arby Barrette, MD  oxyCODONE-acetaminophen (PERCOCET) 5-325 MG per tablet Take 1-2 tablets by mouth every 6 (six) hours as needed for moderate pain. Patient not taking: Reported on 01/16/2016 02/14/14   Purvis Sheffield, MD  Pseudoephedrine-APAP-DM (DAYQUIL PO) Take by mouth.    Historical Provider, MD  Pseudoephedrine-Guaifenesin (MUCINEX D) [phone] MG TB12 Take 1 tablet by mouth 2 (two) times daily. 01/16/16   Raelyn Ensign, PA    Family History Family History  Problem Relation Age of Onset  . Heart disease Mother   . Mental illness Father     Social History Social History  Substance Use Topics  . Smoking status: Never Smoker  . Smokeless tobacco: Never Used  . Alcohol use Yes     Comment: occassional     Allergies   Patient has no known allergies.   Review of Systems Review of Systems A complete  10 system review of systems was obtained and all systems are negative except as noted in the HPI and PMH.    Physical Exam Updated Vital Signs BP (!) 125/101 (BP Location: Right Arm)   Pulse 96   Temp 98.7 F (37.1 C) (Oral)   Resp 20   Ht 6\' 1"  (1.854 m)   Wt 213 lb (96.6 kg)   SpO2 100%   BMI 28.10 kg/m   Physical Exam  Constitutional: He appears well-developed and well-nourished. No distress.  HENT:  Head: Normocephalic and atraumatic.  Eyes: Conjunctivae are normal.  Neck: Neck supple.  Cardiovascular: Normal rate and regular rhythm.  Exam reveals no gallop and no friction rub.   No murmur  heard. Pulmonary/Chest: Effort normal. No respiratory distress. He has no wheezes. He has no rales.  Abdominal: Soft. He exhibits no distension and no mass. There is no tenderness. There is no guarding.  Genitourinary:  Genitourinary Comments: Rectal Exam: Normal perianal tissues. Prostate nontender. No stool in the vault. No obvious mass. Testicular Exam: Testicles smooth and nontender. No scrotal swelling. Penis does have one approximately 1 cm verruca a. No erythema.  Musculoskeletal: Normal range of motion.  Neurological: He is alert.  Skin: Skin is warm and dry.  Psychiatric: He has a normal mood and affect.  Nursing note and vitals reviewed.    ED Treatments / Results  DIAGNOSTIC STUDIES: Oxygen Saturation is 100% on RA, normal by my interpretation.    COORDINATION OF CARE: 3:32 PM- Pt advised of plan for treatment and pt agrees.  Labs (all labs ordered are listed, but only abnormal results are displayed) Labs Reviewed  URINALYSIS, ROUTINE W REFLEX MICROSCOPIC (NOT [facility]) - Abnormal; Notable for the following:       Result Value   Color, Urine AMBER (*)    APPearance CLOUDY (*)    Bilirubin Urine SMALL (*)    Ketones, ur 15 (*)    All other components within normal limits  COMPREHENSIVE METABOLIC PANEL - Abnormal; Notable for the following:    Potassium 3.4 (*)    Chloride 100 (*)    Glucose, Bld 122 (*)    All other components within normal limits  CBC WITH DIFFERENTIAL/PLATELET - Abnormal; Notable for the following:    MCHC 36.1 (*)    Monocytes Absolute 1.3 (*)    All other components within normal limits  OCCULT BLOOD X 1 CARD TO LAB, STOOL - Abnormal; Notable for the following:    Fecal Occult Bld POSITIVE (*)    All other components within normal limits  LIPASE, BLOOD    EKG  EKG Interpretation None       Radiology Ct Abdomen Pelvis W Contrast  Result Date: 09/27/2016 CLINICAL DATA:  Patient with constipation for 9 days. EXAM: CT ABDOMEN AND  PELVIS WITH CONTRAST TECHNIQUE: Multidetector CT imaging of the abdomen and pelvis was performed using the standard protocol following bolus administration of intravenous contrast. CONTRAST:  100mL ISOVUE-300 IOPAMIDOL (ISOVUE-300) INJECTION 61% COMPARISON:  None. FINDINGS: Lower chest: Normal heart size. Lung bases are clear. No pleural effusion. Hepatobiliary: Liver is normal in size and contour. No focal hepatic lesions identified. Gallbladder is unremarkable. No intrahepatic or extrahepatic biliary ductal dilatation. Pancreas: Unremarkable Spleen: Unremarkable Adrenals/Urinary Tract: Normal adrenal glands. Kidneys enhance symmetrically with contrast. Urinary bladder is unremarkable. No hydronephrosis. Stomach/Bowel: The distal colon and rectum are poorly visualized due to streak artifact from right hip arthroplasty. Within the above limitation there appears to be circumferential  wall thickening of the distal colon/rectum with surrounding fat stranding. Additionally there is suggestion of wall thickening of the transverse colon. No evidence for upstream small bowel obstruction. Oral contrast material within the stomach and small bowel. Normal morphology of the stomach. Vascular/Lymphatic: Normal caliber abdominal aorta. No retroperitoneal lymphadenopathy. Reproductive: Prostate is poorly visualized due to streak artifact. Other: Small bilateral fat containing inguinal hernias. No free intraperitoneal air. Musculoskeletal: Patient status post right hip arthroplasty. Findings suggestive of AVN involving the left femoral head. IMPRESSION: Evaluation of the distal colon/ rectum limited due to streak artifact from right hip arthroplasty. There is suggestion of wall thickening of the distal colon/rectum with surrounding fat stranding concerning for colitis in the appropriate clinical setting. Underlying mass is not excluded. If not previously performed, recommend correlation with colonoscopy after resolution of the  acute symptomatology. Additionally there is suggestion of wall thickening of the transverse colon which may be secondary to underdistention or associated colitis. Small bilateral fat containing inguinal hernias. Electronically Signed   By: Annia Beltrew  Davis M.D.   On: 09/27/2016 17:33    Procedures Procedures (including critical care time)  Medications Ordered in ED Medications  0.9 %  sodium chloride infusion (0 mLs Intravenous Stopped 09/27/16 1746)    Followed by  0.9 %  sodium chloride infusion (1,000 mLs Intravenous Not Given 09/27/16 1603)  iopamidol (ISOVUE-300) 61 % injection 100 mL (100 mLs Intravenous Contrast Given 09/27/16 1657)     Initial Impression / Assessment and Plan / ED Course  I have reviewed the triage vital signs and the nursing notes.  Pertinent labs & imaging results that were available during my care of the patient were reviewed by me and considered in my medical decision making (see chart for details).  Clinical Course     Final Clinical Impressions(s) / ED Diagnoses   Final diagnoses:  Colitis  Bowel wall thickening   Patient CT suggest colitis or bowel wall thickening. Patient's had approximately 9 days of symptoms he has felt or constipation. CT does not show significant amount of stool. Patient will be treated empirically for colitis with Levaquin and Flagyl. The concern is for possible autoimmune disease or colon cancer. Patient is counseled on these possibilities and the necessity for GI follow-up for colonoscopy. Patient does not have significant reproducible abdominal pain. No signs of surgical abdomen. Clinically he is well in appearance. No signs of dehydration. New Prescriptions New Prescriptions   CIPROFLOXACIN (CIPRO) 500 MG TABLET    Take 1 tablet (500 mg total) by mouth 2 (two) times daily. One po bid x 7 days   DOCUSATE SODIUM (COLACE) 100 MG CAPSULE    Take 1 capsule (100 mg total) by mouth every 12 (twelve) hours.   METRONIDAZOLE (FLAGYL) 500  MG TABLET    Take 1 tablet (500 mg total) by mouth 2 (two) times daily. One po bid x 7 days      Arby BarretteMarcy Mariyah Upshaw, MD 09/27/16 [phone]

## 2017-02-17 ENCOUNTER — Emergency Department (HOSPITAL_BASED_OUTPATIENT_CLINIC_OR_DEPARTMENT_OTHER): Payer: 59

## 2017-02-17 ENCOUNTER — Emergency Department (HOSPITAL_BASED_OUTPATIENT_CLINIC_OR_DEPARTMENT_OTHER)
Admission: EM | Admit: 2017-02-17 | Discharge: 2017-02-17 | Disposition: A | Discharge status: Home / Self Care | Payer: 59 | Attending: Emergency Medicine | Admitting: Emergency Medicine

## 2017-02-17 ENCOUNTER — Encounter (HOSPITAL_BASED_OUTPATIENT_CLINIC_OR_DEPARTMENT_OTHER): Payer: Self-pay | Admitting: *Deleted

## 2017-02-17 DIAGNOSIS — Z791 Long term (current) use of non-steroidal anti-inflammatories (NSAID): Secondary | ICD-10-CM | POA: Insufficient documentation

## 2017-02-17 DIAGNOSIS — L03031 Cellulitis of right toe: Secondary | ICD-10-CM | POA: Diagnosis not present

## 2017-02-17 DIAGNOSIS — M25562 Pain in left knee: Secondary | ICD-10-CM | POA: Diagnosis present

## 2017-02-17 DIAGNOSIS — M87052 Idiopathic aseptic necrosis of left femur: Secondary | ICD-10-CM | POA: Insufficient documentation

## 2017-02-17 DIAGNOSIS — I10 Essential (primary) hypertension: Secondary | ICD-10-CM | POA: Insufficient documentation

## 2017-02-17 DIAGNOSIS — M10062 Idiopathic gout, left knee: Secondary | ICD-10-CM | POA: Insufficient documentation

## 2017-02-17 DIAGNOSIS — M109 Gout, unspecified: Secondary | ICD-10-CM

## 2017-02-17 LAB — BASIC METABOLIC PANEL
Anion gap: 9 (ref 5–15)
BUN: 12 mg/dL (ref 6–20)
CHLORIDE: 102 mmol/L (ref 101–111)
CO2: 26 mmol/L (ref 22–32)
CREATININE: 0.89 mg/dL (ref 0.61–1.24)
Calcium: 9.2 mg/dL (ref 8.9–10.3)
GFR calc Af Amer: >60 mL/min (ref >60)
GLUCOSE: 131 mg/dL — AB (ref 65–99)
POTASSIUM: 3.7 mmol/L (ref 3.5–5.1)
SODIUM: 137 mmol/L (ref 135–145)

## 2017-02-17 LAB — CBC
HCT: 40.8 % (ref 39.0–52.0)
Hemoglobin: 14.3 g/dL (ref 13.0–17.0)
MCH: 32.6 pg (ref 26.0–34.0)
MCHC: 35 g/dL (ref 30.0–36.0)
MCV: 92.9 fL (ref 78.0–100.0)
PLATELETS: 258 10*3/uL (ref 150–400)
RBC: 4.39 MIL/uL (ref 4.22–5.81)
RDW: 13.3 % (ref 11.5–15.5)
WBC: 8 10*3/uL (ref 4.0–10.5)

## 2017-02-17 MED ORDER — KETOROLAC TROMETHAMINE 30 MG/ML IJ SOLN
30.0000 mg | Freq: Once | INTRAMUSCULAR | Status: AC
  Administered 2017-02-17: 30 mg via INTRAMUSCULAR
  Filled 2017-02-17: qty 1

## 2017-02-17 MED ORDER — PREDNISONE 20 MG PO TABS: 60.0000 mg | ORAL_TABLET | Freq: Every day | ORAL | 0 refills | Status: AC

## 2017-02-17 MED ORDER — OXYCODONE-ACETAMINOPHEN 5-325 MG PO TABS: 2.0000 | ORAL_TABLET | Freq: Four times a day (QID) | ORAL | 0 refills | Status: DC | PRN

## 2017-02-17 MED ORDER — SULFAMETHOXAZOLE-TRIMETHOPRIM 800-160 MG PO TABS: 1.0000 | ORAL_TABLET | Freq: Two times a day (BID) | ORAL | 0 refills | Status: AC

## 2017-02-17 MED ORDER — LIDOCAINE HCL (PF) 1 % IJ SOLN
5.0000 mL | Freq: Once | INTRAMUSCULAR | Status: DC
  Filled 2017-02-17: qty 5

## 2017-02-17 MED ORDER — COLCHICINE 0.6 MG PO TABS: 0.6000 mg | ORAL_TABLET | Freq: Every day | ORAL | 0 refills | Status: DC

## 2017-02-17 MED FILL — SULFAMETHOXAZOLE/TMP DS TAB: 800-160 | 7 days supply | Qty: 14 | Fill #0

## 2017-02-17 MED FILL — predniSONE 20 MG TABS: 20 | 5 days supply | Qty: 15 | Fill #0

## 2017-02-17 MED FILL — OXYCODONE/APAP 5/325 MG TAB: 5-325 | 2 days supply | Qty: 10 | Fill #0

## 2017-02-17 MED FILL — COLCHICINE 0.6 MG TABLET: 0.6 | 30 days supply | Qty: 30 | Fill #0

## 2017-02-17 NOTE — ED Triage Notes (Signed)
Pt reports he thinks he's having a flare-up of gout in L knee since this past weekend. Also reports L hip pain (denies known injury) as well as swelling in bil toes. Denies sob/chest pain, fever, n/v/d. Pt presents to ED with crutches from home.

## 2017-02-17 NOTE — ED Provider Notes (Signed)
[facility] Provider Note   CSN: [id number] Arrival date & time: 02/17/17  1324     History   Chief Complaint No chief complaint on file.   HPI John Singh is a 46 y.o. male.  HPI  46 y.o. male with a hx of DJD of Hip, HTN, Gout, presents to the Emergency Department today complaining of left knee pain over the weekend as well as left hip pain. Denies trauma to area or known mechanism of injury. Denies CP/SOB. No fevers. No N/V. No numbness/tingling. Notes he thinks that pain was exacerbated from working over the weekend on his house. Rates pain 9/10. Pt believes it might be a gout flare. Started taking colchicine yesterday, but has very few pills. Pt drinks occasional ETOH. Pt also ntoes hip replacement on right side by Orthopedic Surgeon x 10 years ago. He was told he could need his left one replaced at some point. No other symptoms noted.   Past Medical History:  Diagnosis Date  . Degenerative joint disease (DJD) of hip   . Gout   . Hypertension     Patient Active Problem List   Diagnosis Date Noted  . Left knee pain 02/14/2014  . Left hip pain 02/14/2014    Past Surgical History:  Procedure Laterality Date  . JOINT REPLACEMENT    . TOTAL HIP ARTHROPLASTY         Home Medications    Prior to Admission medications   Medication Sig Start Date End Date Taking? Authorizing Provider  benzonatate (TESSALON) 100 MG capsule Take 1-2 capsules (100-200 mg total) by mouth 3 (three) times daily as needed for cough. 01/16/16   Raelyn Ensign, PA  ciprofloxacin (CIPRO) 500 MG tablet Take 1 tablet (500 mg total) by mouth 2 (two) times daily. One po bid x 7 days 09/27/16   Arby Barrette, MD  CODEINE SULFATE PO Take by mouth.    Historical Provider, MD  colchicine 0.6 MG tablet Take 0.6 mg by mouth daily.    Historical Provider, MD  docusate sodium (COLACE) 100 MG capsule Take 1 capsule (100 mg total) by mouth every 12 (twelve) hours. 09/27/16   Arby Barrette, MD    ibuprofen (ADVIL,MOTRIN) 800 MG tablet Take 800 mg by mouth every 8 (eight) hours as needed. Reported on 01/16/2016    Historical Provider, MD  metroNIDAZOLE (FLAGYL) 500 MG tablet Take 1 tablet (500 mg total) by mouth 2 (two) times daily. One po bid x 7 days 09/27/16   Arby Barrette, MD  oxyCODONE-acetaminophen (PERCOCET) 5-325 MG per tablet Take 1-2 tablets by mouth every 6 (six) hours as needed for moderate pain. Patient not taking: Reported on 01/16/2016 02/14/14   Purvis Sheffield, MD  Pseudoephedrine-APAP-DM (DAYQUIL PO) Take by mouth.    Historical Provider, MD  Pseudoephedrine-Guaifenesin (MUCINEX D) [phone] MG TB12 Take 1 tablet by mouth 2 (two) times daily. 01/16/16   Raelyn Ensign, PA    Family History Family History  Problem Relation Age of Onset  . Heart disease Mother   . Mental illness Father     Social History Social History  Substance Use Topics  . Smoking status: Never Smoker  . Smokeless tobacco: Never Used  . Alcohol use Yes     Comment: occassional     Allergies   Patient has no known allergies.   Review of Systems Review of Systems  Constitutional: Negative for fever.  Gastrointestinal: Negative for nausea and vomiting.  Musculoskeletal: Positive for arthralgias. Negative for back pain  and neck pain.  Neurological: Negative for numbness.  ROS reviewed and all are negative for acute change except as noted in the HPI.  Physical Exam Updated Vital Signs BP (!) 145/111 (BP Location: Right Arm) Comment: hx of HTN; reports he doesn't take BP meds  Pulse 93   Temp 97.7 F (36.5 C) (Oral)   Resp 20   Ht  (1.854 m)   Wt 98.9 kg   SpO2 100%   BMI 28.76 kg/m   Physical Exam  Constitutional: He is oriented to person, place, and time. Vital signs are normal. He appears well-developed and well-nourished.  HENT:  Head: Normocephalic and atraumatic.  Right Ear: Hearing normal.  Left Ear: Hearing normal.  Eyes: Conjunctivae and EOM are normal. Pupils  are equal, round, and reactive to light.  Neck: Normal range of motion. Neck supple.  Cardiovascular: Normal rate, regular rhythm, normal heart sounds and intact distal pulses.   Pulmonary/Chest: Effort normal and breath sounds normal.  Abdominal: There is no tenderness.  Musculoskeletal: Normal range of motion.  Left Hip ROM intact. Pain with ROM. Distal pulses appreciated. NVI. Left Knee pain with ROM. No obvious swelling or erythema. No edema. Noted on Right great toe with swelling and mild erythema. Purulence eminating from left lateral border of nail. Paronychia noted. Non TTP. ROM intact. NVI. Cap refill <2sec  Neurological: He is alert and oriented to person, place, and time.  Skin: Skin is warm and dry.  Psychiatric: He has a normal mood and affect. His speech is normal and behavior is normal. Thought content normal.  Nursing note and vitals reviewed.  ED Treatments / Results  Labs (all labs ordered are listed, but only abnormal results are displayed) Labs Reviewed - No data to display  EKG  EKG Interpretation None       Radiology No results found.  Procedures .Marland KitchenIncision and Drainage Date/Time: 02/17/2017 10:49 AM Performed by: Audry Pili Authorized by: Audry Pili   Consent:    Consent obtained:  Verbal   Consent given by:  Patient   Risks discussed:  Incomplete drainage and bleeding   Alternatives discussed:  No treatment Location:    Type:  Abscess   Location:  Lower extremity   Lower extremity location:  Toe   Toe location:  R big toe Pre-procedure details:    Skin preparation:  Antiseptic wash and Betadine Anesthesia (see MAR for exact dosages):    Anesthesia method:  None Procedure type:    Complexity:  Simple Procedure details:    Incision types:  Single straight   Scalpel blade:  11   Wound management:  Probed and deloculated and irrigated with saline   Drainage:  Purulent   Drainage amount:  Scant   Wound treatment:  Wound left open   Packing  materials:  1/2 in gauze   (including critical care time)  Medications Ordered in ED Medications - No data to display   Initial Impression / Assessment and Plan / ED Course  I have reviewed the triage vital signs and the nursing notes.  Pertinent labs & imaging results that were available during my care of the patient were reviewed by me and considered in my medical decision making (see chart for details).  Final Clinical Impressions(s) / ED Diagnoses   {I have reviewed and evaluated the relevant imaging studies.  {I have reviewed the relevant previous healthcare records.  {I obtained HPI from historian.   ED Course:  Assessment: Pt is a 46 y.o.  male with hx Gout, HTN, DJD with Right Hip Replacement who presents with left hip/knee pain since this weekend. Noted known DJD x 10 years ago by ortho. Pt believed to be Gout flare and took Colchicine yesterday. No fevers. No N/V. No numbness/tingling. No trauma to area. No swelling or redness. Pt also with noted paronychia on right great toe. Mild erythema. Noted x 2 weeks ago. No fevers. On exam, pt in NAD. Nontoxic/nonseptic appearing. VSS. Afebrile. Lungs CTA. Heart RRR. Abdomen nontender soft. BLE exam unremarkable. NVI. Distal pulses apprecaited. CBC unremarkable. BMP unremarkable. DG Left Hip/Knee with AVN of left hip as well as suspect gout on left knee. Given analgesia in ED. Will DC patient home with analgesia. I have reviewed the [place] Controlled Substance Reporting System. Given bactrim for Paronychia. Plan is to DC Home with follow up to Ortho as well as Podiatry. At time of discharge, Patient is in no acute distress. Vital Signs are stable. Patient is able to ambulate. Patient able to tolerate PO.   Disposition/Plan:  DC Home Additional Verbal discharge instructions given and discussed with patient.  Pt Instructed to f/u with Ortho in the next week for evaluation and treatment of symptoms. Return precautions given Pt  acknowledges and agrees with plan  Supervising Physician Zadie Rhine, MD  Final diagnoses:  Avascular necrosis of left femur (HCC)  Acute gout of left knee, unspecified cause  Paronychia, toe, right    New Prescriptions New Prescriptions   No medications on file     Audry Pili, PA-C 02/17/17 1049    Zadie Rhine, MD 02/17/17 1055

## 2017-02-17 NOTE — Discharge Instructions (Signed)
Please read and follow all provided instructions.  Your diagnoses today include:  1. Avascular necrosis of left femur (HCC)   2. Acute gout of left knee, unspecified cause   3. Paronychia, toe, right     Tests performed today include: Vital signs. See below for your results today.   Medications prescribed:  Take as prescribed   Home care instructions:  Follow any educational materials contained in this packet.  Follow-up instructions: Please follow-up with providers listed above for further evaluation of symptoms and treatment   Return instructions:  Please return to the Emergency Department if you do not get better, if you get worse, or new symptoms OR  - Fever (temperature greater than 101.52F)  - Bleeding that does not stop with holding pressure to the area    -Severe pain (please note that you may be more sore the day after your accident)  - Chest Pain  - Difficulty breathing  - Severe nausea or vomiting  - Inability to tolerate food and liquids  - Passing out  - Skin becoming red around your wounds  - Change in mental status (confusion or lethargy)  - New numbness or weakness    Please return if you have any other emergent concerns.  Additional Information:  Your vital signs today were: BP (!) 145/111 (BP Location: Right Arm) Comment: hx of HTN; reports he doesn't take BP meds   Pulse 93    Temp 97.7 F (36.5 C) (Oral)    Resp 20    Ht  (1.854 m)    Wt 98.9 kg    SpO2 100%    BMI 28.76 kg/m  If your blood pressure (BP) was elevated above 135/85 this visit, please have this repeated by your doctor within one month. ---------------

## 2017-02-17 NOTE — ED Notes (Signed)
ED Provider at bedside. 

## 2017-02-27 ENCOUNTER — Encounter (HOSPITAL_COMMUNITY): Payer: Self-pay | Admitting: Emergency Medicine

## 2017-02-27 ENCOUNTER — Emergency Department (HOSPITAL_COMMUNITY)
Admission: EM | Admit: 2017-02-27 | Discharge: 2017-02-27 | Disposition: A | Discharge status: Home / Self Care | Payer: 59 | Attending: Emergency Medicine | Admitting: Emergency Medicine

## 2017-02-27 DIAGNOSIS — Z79899 Other long term (current) drug therapy: Secondary | ICD-10-CM | POA: Diagnosis not present

## 2017-02-27 DIAGNOSIS — Z96649 Presence of unspecified artificial hip joint: Secondary | ICD-10-CM | POA: Insufficient documentation

## 2017-02-27 DIAGNOSIS — I10 Essential (primary) hypertension: Secondary | ICD-10-CM | POA: Diagnosis present

## 2017-02-27 NOTE — ED Triage Notes (Signed)
Pt seen at orthopedic office and told to come here for hypertension.

## 2017-02-27 NOTE — ED Provider Notes (Signed)
[facility]-EMERGENCY DEPT Provider Note   CSN: [id number] Arrival date & time: 02/27/17  1610     History   Chief Complaint Chief Complaint  Patient presents with  . Hypertension    HPI John Singh is a 46 y.o. male.  Patient is a 46 year old male with a history of gout, hypertension and degenerative joint disease. He presents with elevated blood pressure. He was at an orthopedic office for a visit regarding his joint pain and was noted to have high blood pressure and was sent here for further evaluation. He's completely asymptomatic. He denies any chest pain or shortness of breath. No headache or dizziness. No stroke symptoms. No vision changes or speech deficits. He states he was previous a taking antihypertensive medication and stopped it about 3 years ago because he thought his blood pressure was better.      Past Medical History:  Diagnosis Date  . Degenerative joint disease (DJD) of hip   . Gout   . Hypertension     Patient Active Problem List   Diagnosis Date Noted  . Left knee pain 02/14/2014  . Left hip pain 02/14/2014    Past Surgical History:  Procedure Laterality Date  . JOINT REPLACEMENT    . TOTAL HIP ARTHROPLASTY         Home Medications    Prior to Admission medications   Medication Sig Start Date End Date Taking? Authorizing Provider  colchicine 0.6 MG tablet Take 1 tablet (0.6 mg total) by mouth daily. Patient taking differently: Take 0.6 mg by mouth daily as needed (for gout flares).  02/17/17  Yes Audry Pili, PA-C  vitamin B-12 (CYANOCOBALAMIN) 1000 MCG tablet Take 1,000 mcg by mouth daily.   Yes Historical Provider, MD    Family History Family History  Problem Relation Age of Onset  . Heart disease Mother   . Mental illness Father     Social History Social History  Substance Use Topics  . Smoking status: Never Smoker  . Smokeless tobacco: Never Used  . Alcohol use Yes     Comment: occassional     Allergies   Patient has no known  allergies.   Review of Systems Review of Systems  Constitutional: Negative for chills, diaphoresis, fatigue and fever.  HENT: Negative for congestion, rhinorrhea and sneezing.   Eyes: Negative.   Respiratory: Negative for cough, chest tightness and shortness of breath.   Cardiovascular: Negative for chest pain and leg swelling.  Gastrointestinal: Negative for abdominal pain, blood in stool, diarrhea, nausea and vomiting.  Genitourinary: Negative for difficulty urinating, flank pain, frequency and hematuria.  Musculoskeletal: Positive for arthralgias. Negative for back pain.  Skin: Negative for rash.  Neurological: Negative for dizziness, speech difficulty, weakness, numbness and headaches.     Physical Exam Updated Vital Signs BP (!) 167/134 (BP Location: Left Arm)   Pulse 98   Temp 98.4 F (36.9 C) (Oral)   Resp 20   Ht  (1.854 m)   Wt 217 lb 8 oz (98.7 kg)   SpO2 99%   BMI 28.70 kg/m   Physical Exam  Constitutional: He is oriented to person, place, and time. He appears well-developed and well-nourished.  HENT:  Head: Normocephalic and atraumatic.  Eyes: Pupils are equal, round, and reactive to light.  Neck: Normal range of motion. Neck supple.  Cardiovascular: Normal rate, regular rhythm and normal heart sounds.   Pulmonary/Chest: Effort normal and breath sounds normal. No respiratory distress. He has no wheezes. He has no  rales. He exhibits no tenderness.  Abdominal: Soft. Bowel sounds are normal. There is no tenderness. There is no rebound and no guarding.  Musculoskeletal: Normal range of motion. He exhibits no edema.  Lymphadenopathy:    He has no cervical adenopathy.  Neurological: He is alert and oriented to person, place, and time.  Motor 5/5 all extremities Sensation grossly intact to LT all extremities Finger to Nose intact, no pronator drift CN II-XII grossly intact Gait normal   Skin: Skin is warm and dry. No rash noted.  Psychiatric: He has a  normal mood and affect.     ED Treatments / Results  Labs (all labs ordered are listed, but only abnormal results are displayed) Labs Reviewed - No data to display  EKG  EKG Interpretation None       Radiology No results found.  Procedures Procedures (including critical care time)  Medications Ordered in ED Medications - No data to display   Initial Impression / Assessment and Plan / ED Course  I have reviewed the triage vital signs and the nursing notes.  Pertinent labs & imaging results that were available during my care of the patient were reviewed by me and considered in my medical decision making (see chart for details).     Patient presents with an elevated blood pressure. He is asymptomatic. He's been off his blood pressure medicine for about 3 years. He does not remember what he was previously taking. Repeat blood pressure in the ED was 152/110. I did have a discussion about starting him back on his blood pressure medicine. He states that he can go to his primary care office tomorrow morning. They have Saturday walk-in office hours. He states he would rather do that and has me give him a prescription because he does not remember what he was taking previously. I feel that this is appropriate given that he is completely asymptomatic. On record review, his blood pressure has been similar on his prior visits. He was given precautions to return for any symptoms such as chest pain, shortness of breath or any stroke symptoms.  Final Clinical Impressions(s) / ED Diagnoses   Final diagnoses:  Essential hypertension    New Prescriptions New Prescriptions   No medications on file     Rolan Bucco, MD 02/27/17 1724

## 2017-02-28 DIAGNOSIS — I10 Essential (primary) hypertension: Secondary | ICD-10-CM | POA: Insufficient documentation

## 2017-04-26 LAB — BASIC METABOLIC PANEL
BUN: 12 (ref 4–21)
CREATININE: 1 (ref 0.6–1.3)
GLUCOSE: 86
POTASSIUM: 3.7 (ref 3.4–5.3)
Sodium: 141 (ref 137–147)

## 2017-04-26 LAB — LIPID PANEL
Cholesterol: 232 — AB (ref 0–200)
HDL: 44 (ref 35–70)
LDL Cholesterol: 154
Triglycerides: 181 — AB (ref 40–160)

## 2017-04-26 LAB — HEPATIC FUNCTION PANEL
ALT: 74 — AB (ref 10–40)
AST: 54 — AB (ref 14–40)
Alkaline Phosphatase: 78 (ref 25–125)
Bilirubin, Total: 0.9

## 2017-04-29 DIAGNOSIS — M109 Gout, unspecified: Secondary | ICD-10-CM | POA: Insufficient documentation

## 2017-04-29 DIAGNOSIS — R7989 Other specified abnormal findings of blood chemistry: Secondary | ICD-10-CM | POA: Insufficient documentation

## 2017-11-21 LAB — BASIC METABOLIC PANEL
BUN: 10 (ref 4–21)
Creatinine: 1 (ref 0.6–1.3)
GLUCOSE: 91
Potassium: 4.5 (ref 3.4–5.3)
Sodium: 142 (ref 137–147)

## 2017-11-21 LAB — HEPATIC FUNCTION PANEL
ALT: 54 — AB (ref 10–40)
AST: 42 — AB (ref 14–40)
Alkaline Phosphatase: 86 (ref 25–125)
BILIRUBIN, TOTAL: 0.7

## 2018-03-23 IMAGING — CR DG KNEE COMPLETE 4+V*L*
4 series · 4 of 4 positions shown · non-contrast
Comparison: None.

CLINICAL DATA: Patient states that he has been having left hip and
left knee pains since this past weekend, knee pain is greater than
hip pains, left knee is swollen and painful all over, denies any
injury, patient has hx of gout, no other complaints

EXAM:
LEFT KNEE - COMPLETE 4+ VIEW

[t knee ap left]
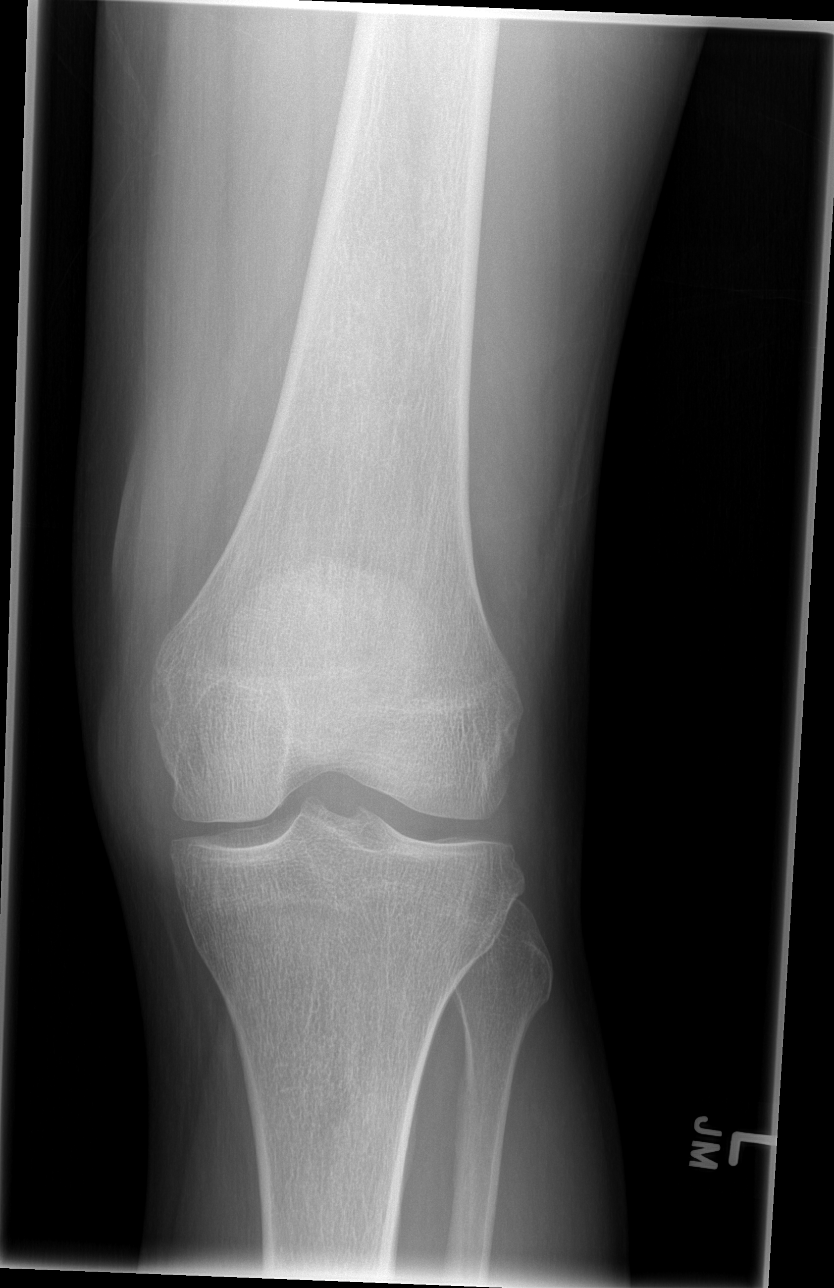

[t knee oblique left (1 of 2)]
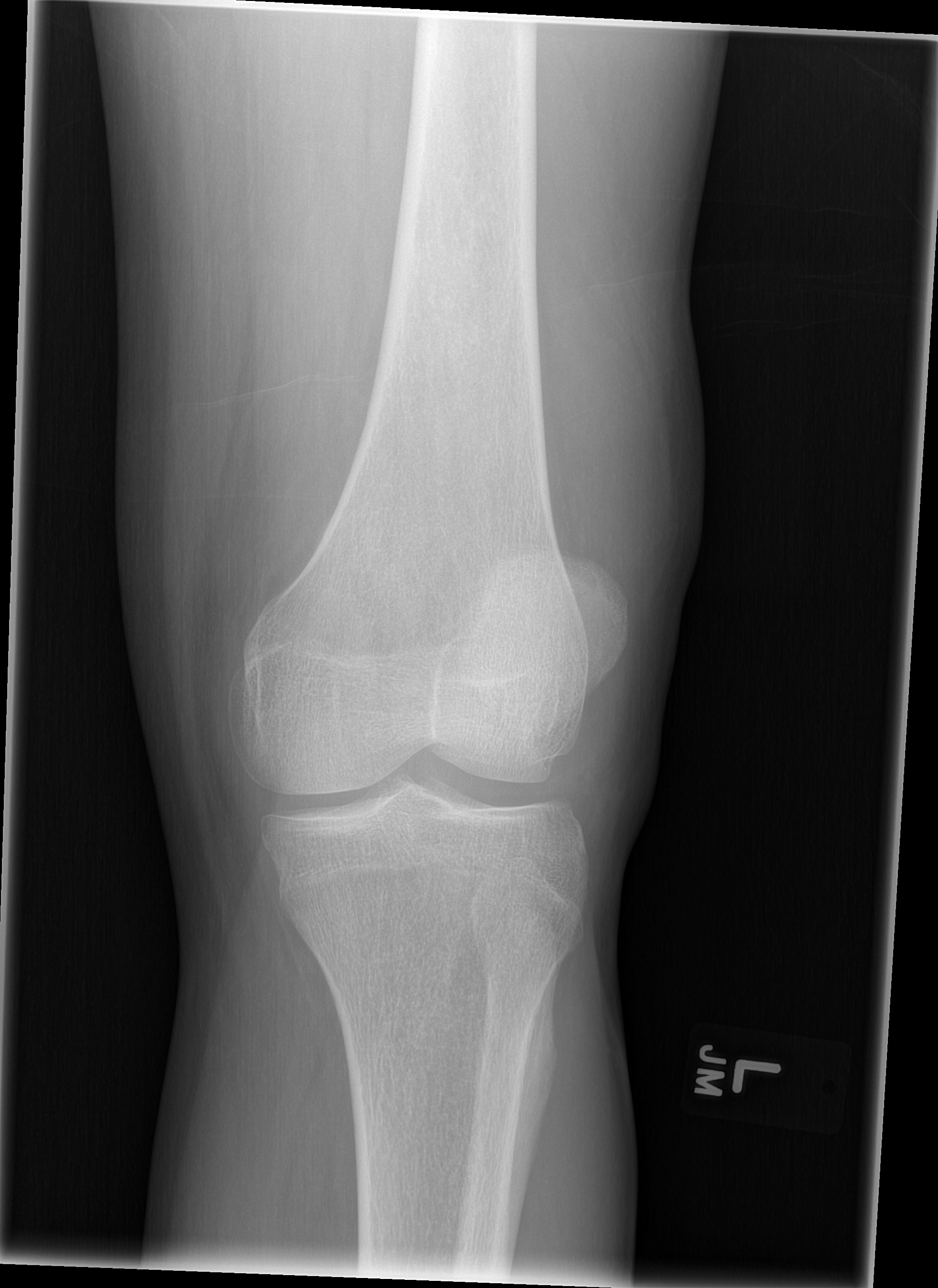

[t knee oblique left (2 of 2)]
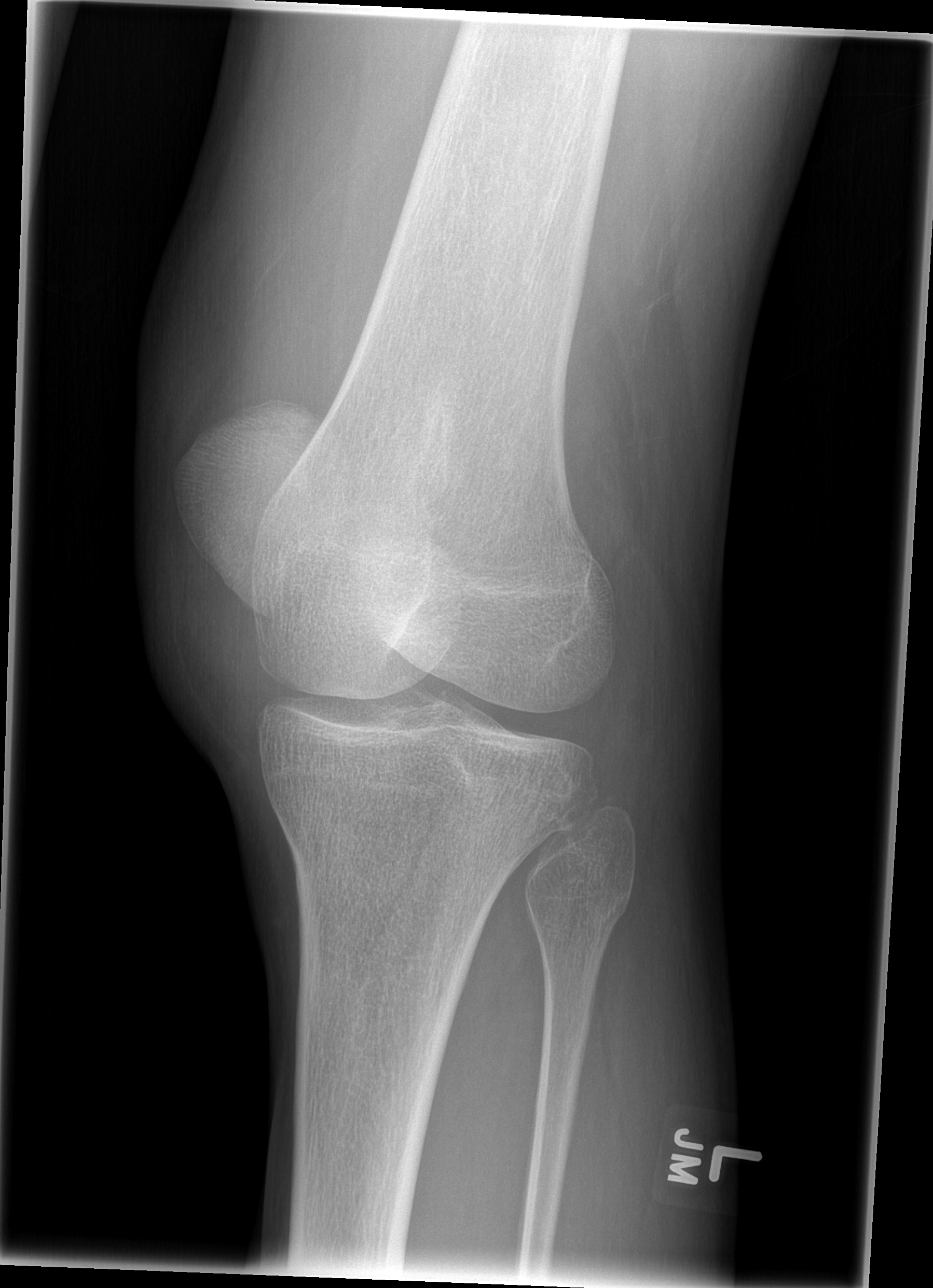

[t knee lat left]
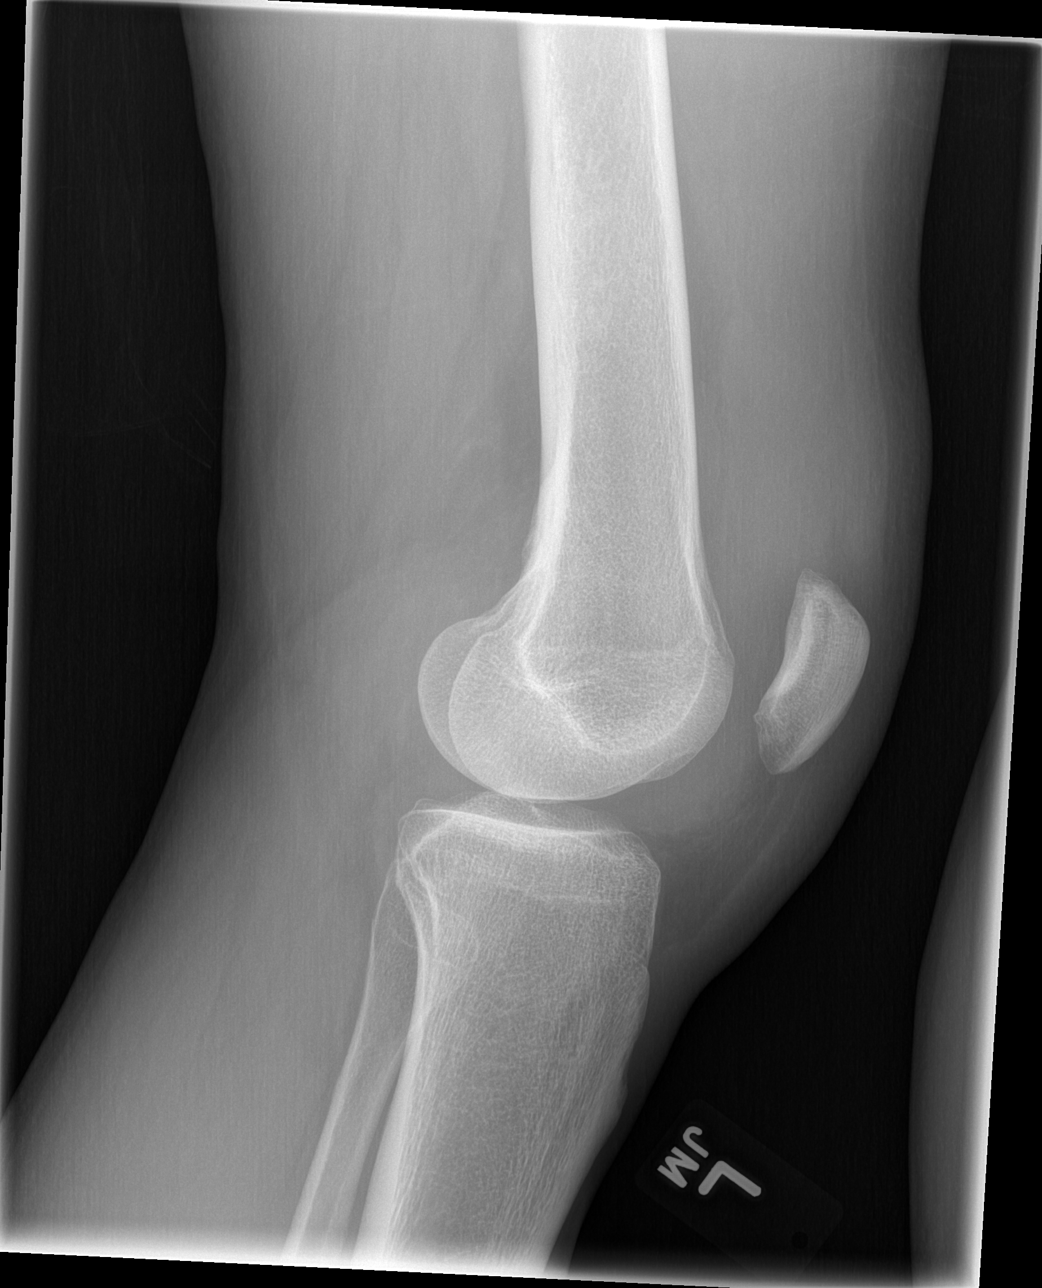

[4 of 4 positions shown; findings below may reference images not displayed]

FINDINGS: No acute fracture or dislocation. Large left knee joint effusion. No
evidence of arthropathy or other focal bone abnormality. Soft
tissues are unremarkable.
IMPRESSION: 1.  No acute osseous injury of the left knee.
2. Large nonspecific left joint effusion.

## 2018-03-23 IMAGING — CR DG HIP (WITH OR WITHOUT PELVIS) 2-3V*L*
3 series · 3 of 3 positions shown · non-contrast
Comparison: CT 09/27/2016 .

CLINICAL DATA: Pain.  No history of injury.

EXAM:
DG HIP (WITH OR WITHOUT PELVIS) 2-3V LEFT

[t pelvis a.p.]
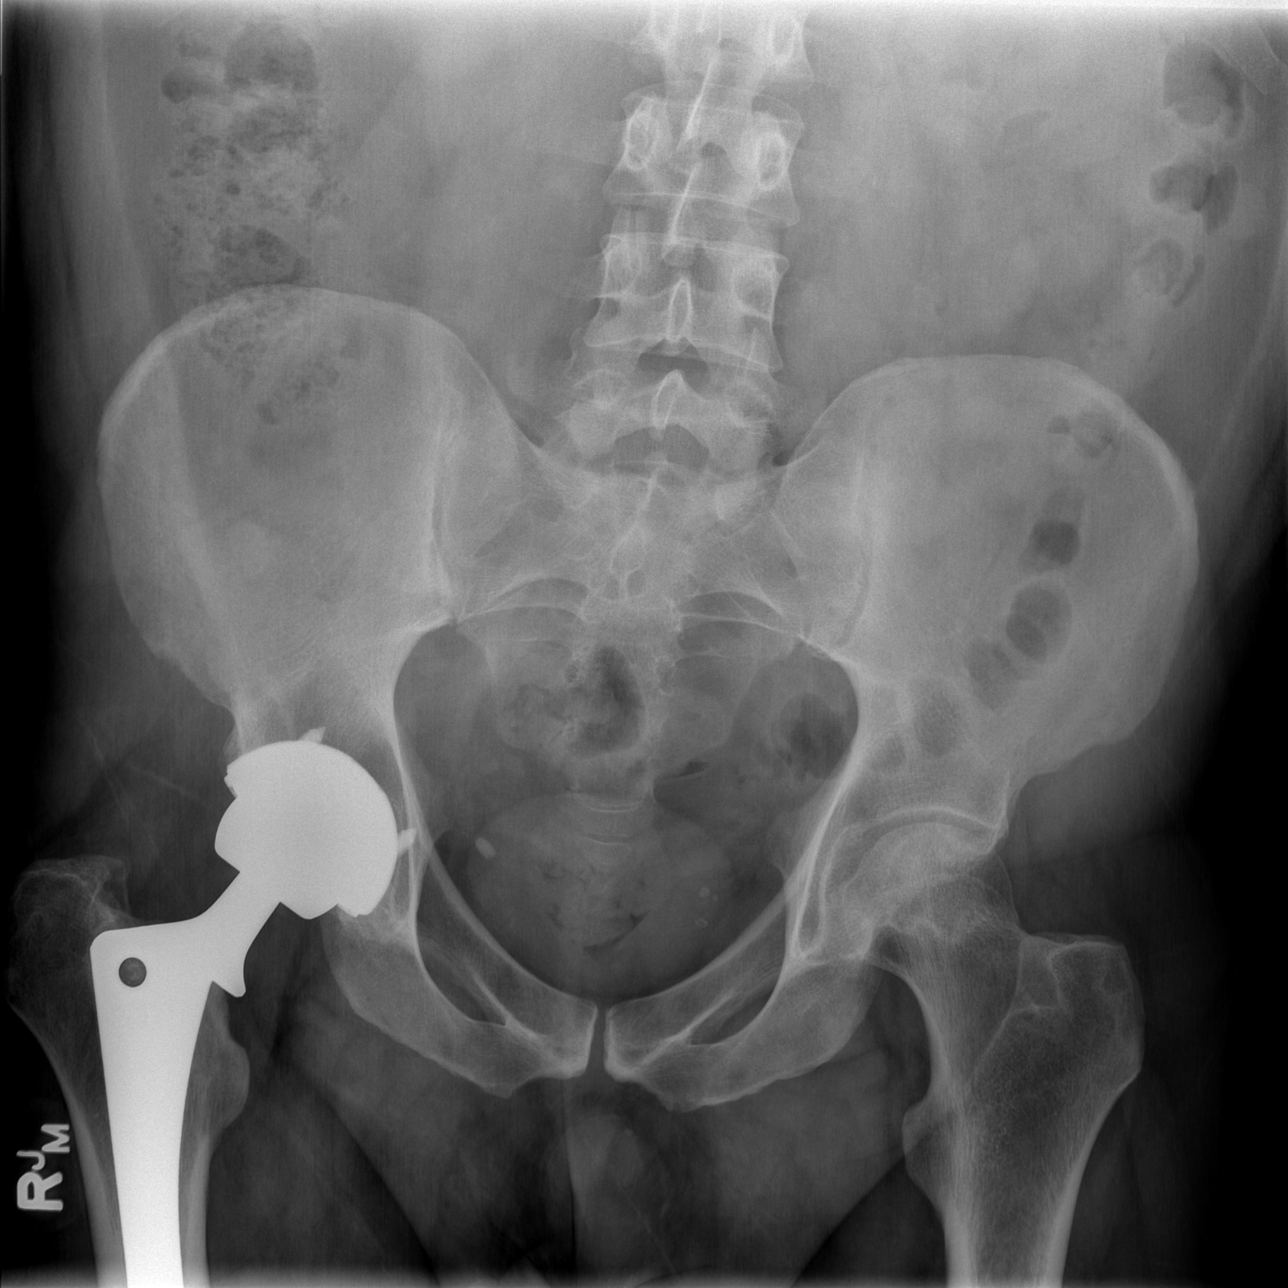

[t hip ap left]
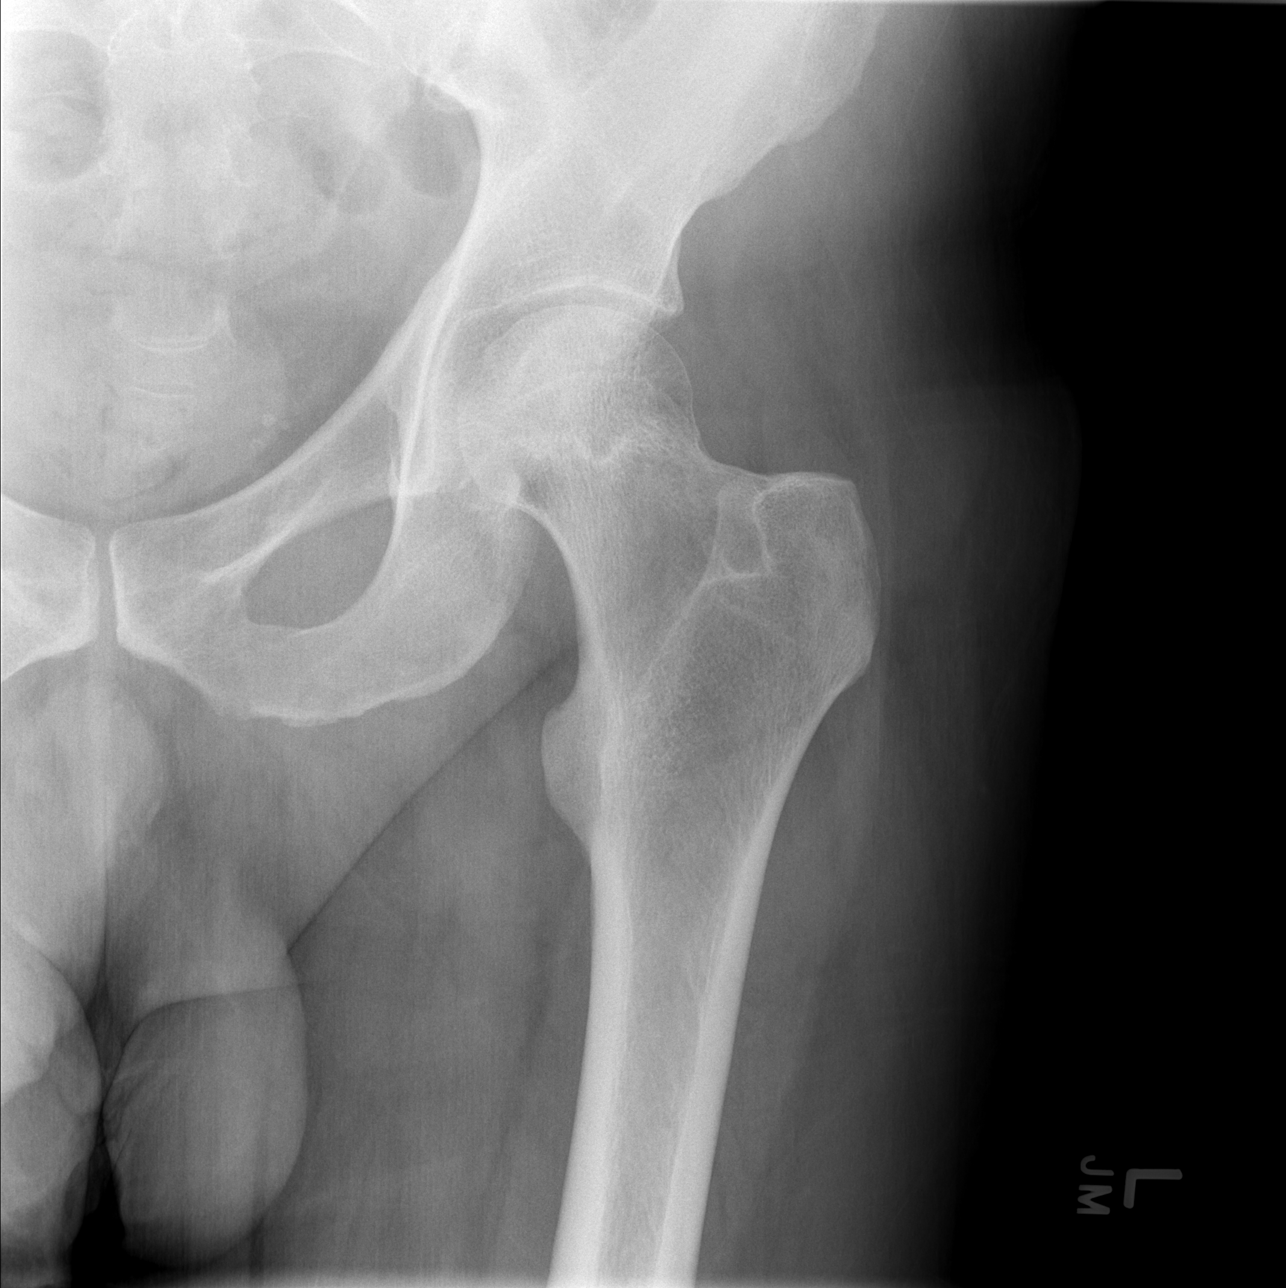

[t hip frog leg left]
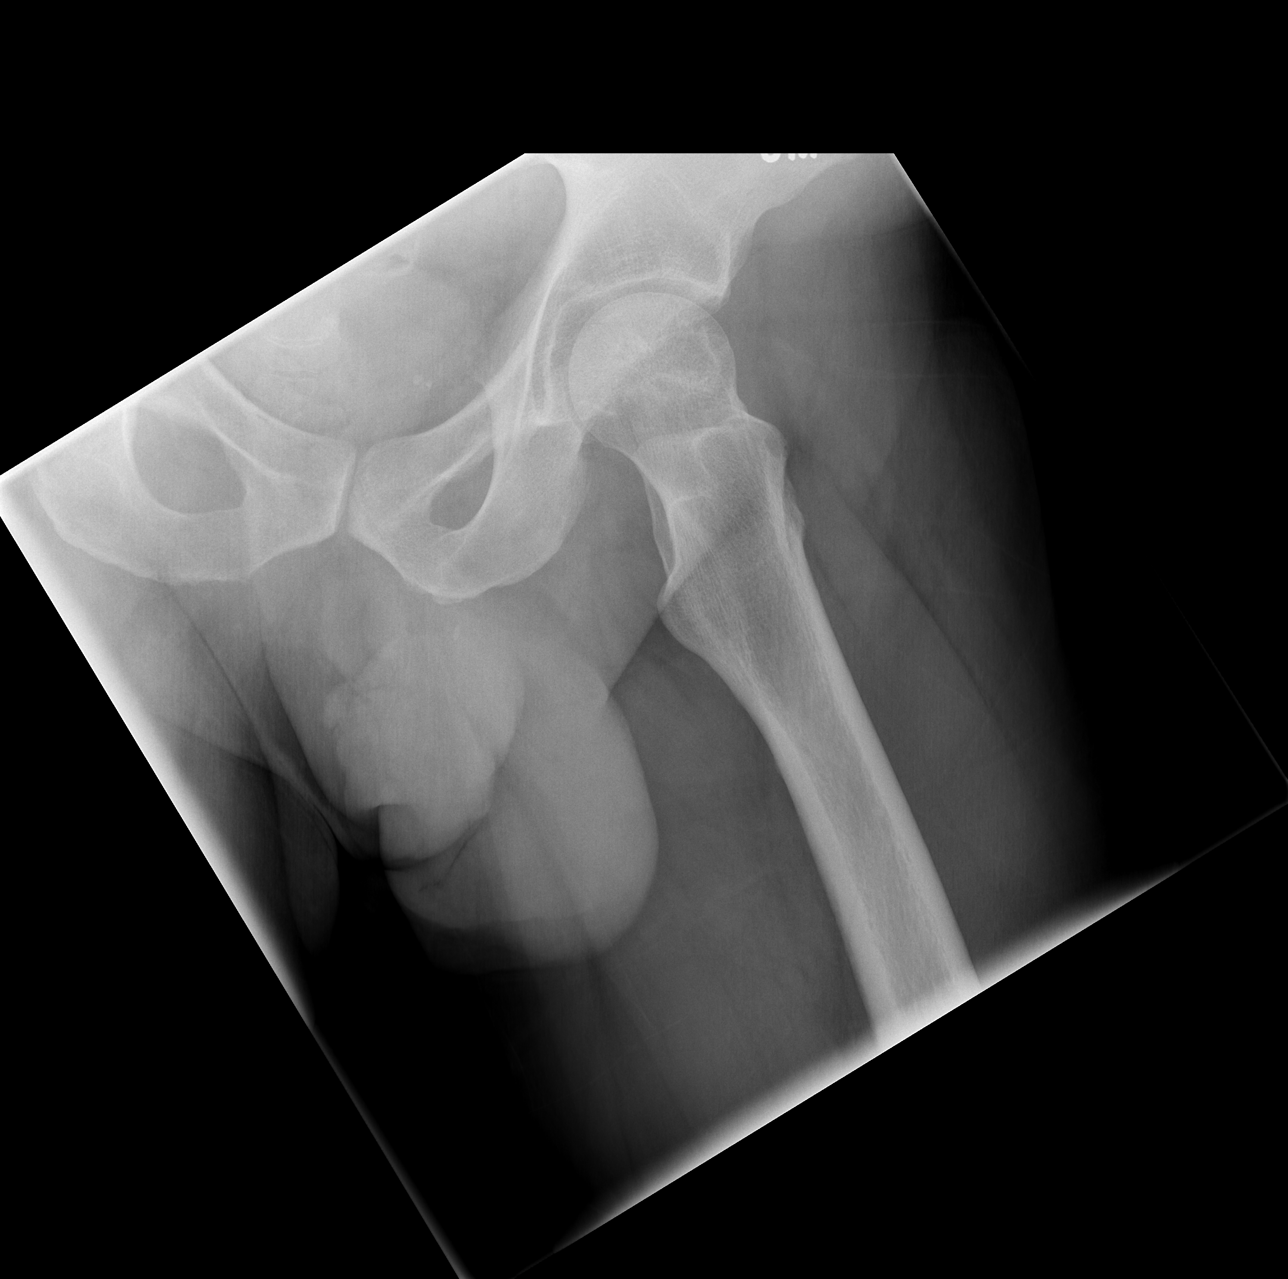

[3 of 3 positions shown; findings below may reference images not displayed]

FINDINGS: Total right hip replacement. Hardware intact. Findings again noted
consistent AP and of the left femoral head. No change from prior CT
of 09/27/2016. Pelvic calcifications noted consistent phleboliths.
These appears stable.
IMPRESSION: 1. No acute abnormality.

2. Total right hip replacement. Hardware intact. Anatomic alignment.

3. Findings consistent AVM left femoral head, no change prior CT
study of 09/27/2016.

## 2018-05-14 ENCOUNTER — Ambulatory Visit: Payer: 59 | Admitting: Family Medicine

## 2018-05-14 ENCOUNTER — Encounter: Payer: Self-pay | Admitting: Family Medicine

## 2018-05-14 VITALS — BP 130/80 | HR 110 | Ht 73.0 in | Wt 212.1 lb

## 2018-05-14 DIAGNOSIS — I10 Essential (primary) hypertension: Secondary | ICD-10-CM

## 2018-05-14 DIAGNOSIS — F339 Major depressive disorder, recurrent, unspecified: Secondary | ICD-10-CM

## 2018-05-14 DIAGNOSIS — Z0001 Encounter for general adult medical examination with abnormal findings: Secondary | ICD-10-CM

## 2018-05-14 DIAGNOSIS — Z8739 Personal history of other diseases of the musculoskeletal system and connective tissue: Secondary | ICD-10-CM

## 2018-05-14 DIAGNOSIS — Z7289 Other problems related to lifestyle: Secondary | ICD-10-CM

## 2018-05-14 DIAGNOSIS — Z789 Other specified health status: Secondary | ICD-10-CM

## 2018-05-14 DIAGNOSIS — M109 Gout, unspecified: Secondary | ICD-10-CM | POA: Diagnosis not present

## 2018-05-14 LAB — URINALYSIS, ROUTINE W REFLEX MICROSCOPIC
Bilirubin Urine: NEGATIVE
Hgb urine dipstick: NEGATIVE
KETONES UR: NEGATIVE
Leukocytes, UA: NEGATIVE
Nitrite: NEGATIVE
PH: 5.5 (ref 5.0–8.0)
SPECIFIC GRAVITY, URINE: 1.025 (ref 1.000–1.030)
Total Protein, Urine: NEGATIVE
URINE GLUCOSE: NEGATIVE
UROBILINOGEN UA: 0.2 (ref 0.0–1.0)

## 2018-05-14 LAB — COMPREHENSIVE METABOLIC PANEL
ALK PHOS: 96 U/L (ref 39–117)
ALT: 58 U/L — AB (ref 0–53)
AST: 59 U/L — ABNORMAL HIGH (ref 0–37)
Albumin: 4.3 g/dL (ref 3.5–5.2)
BILIRUBIN TOTAL: 0.8 mg/dL (ref 0.2–1.2)
BUN: 11 mg/dL (ref 6–23)
CO2: 26 mEq/L (ref 19–32)
Calcium: 10 mg/dL (ref 8.4–10.5)
Chloride: 103 mEq/L (ref 96–112)
Creatinine, Ser: 1.1 mg/dL (ref 0.40–1.50)
GFR: 76.41 mL/min (ref >60.00)
GLUCOSE: 103 mg/dL — AB (ref 70–99)
POTASSIUM: 4 meq/L (ref 3.5–5.1)
SODIUM: 139 meq/L (ref 135–145)
TOTAL PROTEIN: 7.5 g/dL (ref 6.0–8.3)

## 2018-05-14 LAB — CBC
HCT: 47.3 % (ref 39.0–52.0)
Hemoglobin: 16.5 g/dL (ref 13.0–17.0)
MCHC: 34.8 g/dL (ref 30.0–36.0)
MCV: 94.2 fl (ref 78.0–100.0)
Platelets: 289 10*3/uL (ref 150.0–400.0)
RBC: 5.02 Mil/uL (ref 4.22–5.81)
RDW: 13.8 % (ref 11.5–15.5)
WBC: 7 10*3/uL (ref 4.0–10.5)

## 2018-05-14 LAB — MICROALBUMIN / CREATININE URINE RATIO
CREATININE, U: 244.2 mg/dL
MICROALB UR: 1.3 mg/dL (ref 0.0–1.9)
Microalb Creat Ratio: 0.5 mg/g (ref 0.0–30.0)

## 2018-05-14 LAB — LIPID PANEL
Cholesterol: 233 mg/dL — ABNORMAL HIGH (ref 0–200)
HDL: 47.9 mg/dL (ref >39.00)
LDL CALC: 153 mg/dL — AB (ref 0–99)
NONHDL: 184.61
Total CHOL/HDL Ratio: 5
Triglycerides: 157 mg/dL — ABNORMAL HIGH (ref 0.0–149.0)
VLDL: 31.4 mg/dL (ref 0.0–40.0)

## 2018-05-14 LAB — GAMMA GT: GGT: 44 U/L (ref 7–51)

## 2018-05-14 LAB — URIC ACID: Uric Acid, Serum: 7.9 mg/dL — ABNORMAL HIGH (ref 4.0–7.8)

## 2018-05-14 MED ORDER — PAROXETINE HCL ER 12.5 MG PO TB24: ORAL_TABLET | ORAL | 0 refills | Status: DC

## 2018-05-14 NOTE — Progress Notes (Addendum)
Subjective:  Patient ID: John Singh, male    DOB: 1970-12-12  Age: 47 y.o. MRN: [id number]  CC: Establish Care   HPI John Singh presents for establishment of care and follow-up of multiple medical issues.  He has a past medical history of hypertension that is well controlled with losartan/HCTZ.  He is having no issues with the drug.  He also has a past medical history of gout that he treats episodically with colchicine.  This seems to work for him.  He has never taken Indocin or allopurinol for prophylaxis.  Significant past medical history for heavy alcohol usage.  Over the last 4 months he is attempted to cut back on his drinking.  He is only drinking on the weekends now.  He admits to drinking up up to a pint of liquor daily.  He realizes that this is still too much.  He has a past medical history of bilateral avascular necrosis of his hips and this was also believed to be associated with his alcohol usage.  He tells me that since he cut back on his alcohol he has not been sleeping well at night.  He has taken Prozac in the past for depression but cannot remember the results.  He does not smoke cigarettes but he admits to marijuana usage.  His father was an alcoholic and passed from Alzheimer's in his early 4970s.  His mom is 3575 and has had coronary artery disease.  Patient lives with his wife.  She has been concerned about his drinking.  Patient does admit to being depressed.  He sometimes wonders about whether or not he should go along.  He adamantly denies any thoughts of self-harm or injuring himself.  He has sought psychiatric care for these issues in the past but was lost to follow-up.  History John Singh has a past medical history of Degenerative joint disease (DJD) of hip, Gout, and Hypertension.   He has a past surgical history that includes Total hip arthroplasty and Joint replacement.   His family history includes Alcohol abuse in his brother and father; Drug abuse in his brother; Heart  disease in his mother.He reports that he has never smoked. He has never used smokeless tobacco. He reports that he drinks alcohol. He reports that he has current or past drug history. Drug: Marijuana.  Outpatient Medications Prior to Visit  Medication Sig Dispense Refill  . colchicine 0.6 MG tablet Take 1 tablet (0.6 mg total) by mouth daily. (Patient taking differently: Take 0.6 mg by mouth daily as needed (for gout flares). ) 30 tablet 0  . losartan-hydrochlorothiazide (HYZAAR) 100-12.5 MG tablet Take 1 tablet by mouth daily.    . vitamin B-12 (CYANOCOBALAMIN) 1000 MCG tablet Take 1,000 mcg by mouth daily.     No facility-administered medications prior to visit.     ROS Review of Systems  Constitutional: Negative for chills, fatigue, fever and unexpected weight change.  HENT: Negative.   Eyes: Negative.   Respiratory: Negative.   Cardiovascular: Negative.   Gastrointestinal: Negative.   Endocrine: Negative for cold intolerance and heat intolerance.  Genitourinary: Negative.   Musculoskeletal: Negative for gait problem and joint swelling.  Skin: Negative for color change and wound.  Allergic/Immunologic: Negative for immunocompromised state.  Neurological: Negative for weakness and headaches.  Hematological: Does not bruise/bleed easily.  Psychiatric/Behavioral: Positive for dysphoric mood and sleep disturbance. Negative for self-injury and suicidal ideas.    Objective:  BP 130/80   Pulse (!) 110   Ht 6'  1" (1.854 m)   Wt 212 lb 2 oz (96.2 kg)   SpO2 99%   BMI 27.99 kg/m   Physical Exam  Constitutional: He is oriented to person, place, and time. He appears well-developed and well-nourished. No distress.  HENT:  Head: Normocephalic and atraumatic.  Right Ear: External ear normal.  Left Ear: External ear normal.  Mouth/Throat: Oropharynx is clear and moist. No oropharyngeal exudate.  Eyes: Pupils are equal, round, and reactive to light. Conjunctivae and EOM are normal.  Right eye exhibits no discharge. Left eye exhibits no discharge. No scleral icterus.  Neck: Normal range of motion. Neck supple. No JVD present. No tracheal deviation present. No thyromegaly present.  Cardiovascular: Normal rate, regular rhythm and normal heart sounds.  Pulmonary/Chest: Effort normal and breath sounds normal.  Lymphadenopathy:    He has no cervical adenopathy.  Neurological: He is alert and oriented to person, place, and time.  Skin: Skin is warm and dry. He is not diaphoretic.  Psychiatric: He has a normal mood and affect. His behavior is normal.      Assessment & Plan:   Bravery was seen today for establish care.  Diagnoses and all orders for this visit:  Essential hypertension -     CBC -     Comprehensive metabolic panel -     Urinalysis, Routine w reflex microscopic -     Microalbumin / creatinine urine ratio  Alcohol use -     CBC -     Comprehensive metabolic panel -     Gamma GT -     Ambulatory referral to Psychiatry  History of avascular necrosis of capital femoral epiphysis  Gout, unspecified cause, unspecified chronicity, unspecified site -     CBC -     Comprehensive metabolic panel -     Uric acid -     Urinalysis, Routine w reflex microscopic  Encounter for health maintenance examination with abnormal findings -     CBC -     Comprehensive metabolic panel -     Lipid panel  Depression, recurrent (HCC) -     Ambulatory referral to Psychiatry -     PARoxetine (PAXIL-CR) 12.5 MG 24 hr tablet; Take pill daily for one week and then increase to 2 pills daily   I have discontinued Aayush Tavis's vitamin B-12. I am also having him start on PARoxetine. Additionally, I am having him maintain his colchicine and losartan-hydrochlorothiazide.  Meds ordered this encounter  Medications  . PARoxetine (PAXIL-CR) 12.5 MG 24 hr tablet    Sig: Take pill daily for one week and then increase to 2 pills daily    Dispense:  60 tablet    Refill:  0      Follow-up: Return in about 1 month (around 06/14/2018).  Mliss Sax, MD

## 2018-05-14 NOTE — Patient Instructions (Signed)
DASH Eating Plan DASH stands for "Dietary Approaches to Stop Hypertension." The DASH eating plan is a healthy eating plan that has been shown to reduce high blood pressure (hypertension). It may also reduce your risk for type 2 diabetes, heart disease, and stroke. The DASH eating plan may also help with weight loss. What are tips for following this plan? General guidelines  Avoid eating more than 2,300 mg (milligrams) of salt (sodium) a day. If you have hypertension, you may need to reduce your sodium intake to 1,500 mg a day.  Limit alcohol intake to no more than 1 drink a day for nonpregnant women and 2 drinks a day for men. One drink equals 12 oz of beer, 5 oz of wine, or 1 oz of hard liquor.  Work with your health care provider to maintain a healthy body weight or to lose weight. Ask what an ideal weight is for you.  Get at least 30 minutes of exercise that causes your heart to beat faster (aerobic exercise) most days of the week. Activities may include walking, swimming, or biking.  Work with your health care provider or diet and nutrition specialist (dietitian) to adjust your eating plan to your individual calorie needs. Reading food labels  Check food labels for the amount of sodium per serving. Choose foods with less than 5 percent of the Daily Value of sodium. Generally, foods with less than 300 mg of sodium per serving fit into this eating plan.  To find whole grains, look for the word "whole" as the first word in the ingredient list. Shopping  Buy products labeled as "low-sodium" or "no salt added."  Buy fresh foods. Avoid canned foods and premade or frozen meals. Cooking  Avoid adding salt when cooking. Use salt-free seasonings or herbs instead of table salt or sea salt. Check with your health care provider or pharmacist before using salt substitutes.  Do not fry foods. Cook foods using healthy methods such as baking, boiling, grilling, and broiling instead.  Cook with  heart-healthy oils, such as olive, canola, soybean, or sunflower oil. Meal planning   Eat a balanced diet that includes: ? 5 or more servings of fruits and vegetables each day. At each meal, try to fill half of your plate with fruits and vegetables. ? Up to 6-8 servings of whole grains each day. ? Less than 6 oz of lean meat, poultry, or fish each day. A 3-oz serving of meat is about the same size as a deck of cards. One egg equals 1 oz. ? 2 servings of low-fat dairy each day. ? A serving of nuts, seeds, or beans 5 times each week. ? Heart-healthy fats. Healthy fats called Omega-3 fatty acids are found in foods such as flaxseeds and coldwater fish, like sardines, salmon, and mackerel.  Limit how much you eat of the following: ? Canned or prepackaged foods. ? Food that is high in trans fat, such as fried foods. ? Food that is high in saturated fat, such as fatty meat. ? Sweets, desserts, sugary drinks, and other foods with added sugar. ? Full-fat dairy products.  Do not salt foods before eating.  Try to eat at least 2 vegetarian meals each week.  Eat more home-cooked food and less restaurant, buffet, and fast food.  When eating at a restaurant, ask that your food be prepared with less salt or no salt, if possible. What foods are recommended? The items listed may not be a complete list. Talk with your dietitian about what   dietary choices are best for you. Grains Whole-grain or whole-wheat bread. Whole-grain or whole-wheat pasta. Brown rice. Oatmeal. Quinoa. Bulgur. Whole-grain and low-sodium cereals. Pita bread. Low-fat, low-sodium crackers. Whole-wheat flour tortillas. Vegetables Fresh or frozen vegetables (raw, steamed, roasted, or grilled). Low-sodium or reduced-sodium tomato and vegetable juice. Low-sodium or reduced-sodium tomato sauce and tomato paste. Low-sodium or reduced-sodium canned vegetables. Fruits All fresh, dried, or frozen fruit. Canned fruit in natural juice (without  added sugar). Meat and other protein foods Skinless chicken or turkey. Ground chicken or turkey. Pork with fat trimmed off. Fish and seafood. Egg whites. Dried beans, peas, or lentils. Unsalted nuts, nut butters, and seeds. Unsalted canned beans. Lean cuts of beef with fat trimmed off. Low-sodium, lean deli meat. Dairy Low-fat (1%) or fat-free (skim) milk. Fat-free, low-fat, or reduced-fat cheeses. Nonfat, low-sodium ricotta or cottage cheese. Low-fat or nonfat yogurt. Low-fat, low-sodium cheese. Fats and oils Soft margarine without trans fats. Vegetable oil. Low-fat, reduced-fat, or light mayonnaise and salad dressings (reduced-sodium). Canola, safflower, olive, soybean, and sunflower oils. Avocado. Seasoning and other foods Herbs. Spices. Seasoning mixes without salt. Unsalted popcorn and pretzels. Fat-free sweets. What foods are not recommended? The items listed may not be a complete list. Talk with your dietitian about what dietary choices are best for you. Grains Baked goods made with fat, such as croissants, muffins, or some breads. Dry pasta or rice meal packs. Vegetables Creamed or fried vegetables. Vegetables in a cheese sauce. Regular canned vegetables (not low-sodium or reduced-sodium). Regular canned tomato sauce and paste (not low-sodium or reduced-sodium). Regular tomato and vegetable juice (not low-sodium or reduced-sodium). Pickles. Olives. Fruits Canned fruit in a light or heavy syrup. Fried fruit. Fruit in cream or butter sauce. Meat and other protein foods Fatty cuts of meat. Ribs. Fried meat. Bacon. Sausage. Bologna and other processed lunch meats. Salami. Fatback. Hotdogs. Bratwurst. Salted nuts and seeds. Canned beans with added salt. Canned or smoked fish. Whole eggs or egg yolks. Chicken or turkey with skin. Dairy Whole or 2% milk, cream, and half-and-half. Whole or full-fat cream cheese. Whole-fat or sweetened yogurt. Full-fat cheese. Nondairy creamers. Whipped toppings.  Processed cheese and cheese spreads. Fats and oils Butter. Stick margarine. Lard. Shortening. Ghee. Bacon fat. Tropical oils, such as coconut, palm kernel, or palm oil. Seasoning and other foods Salted popcorn and pretzels. Onion salt, garlic salt, seasoned salt, table salt, and sea salt. Worcestershire sauce. Tartar sauce. Barbecue sauce. Teriyaki sauce. Soy sauce, including reduced-sodium. Steak sauce. Canned and packaged gravies. Fish sauce. Oyster sauce. Cocktail sauce. Horseradish that you find on the shelf. Ketchup. Mustard. Meat flavorings and tenderizers. Bouillon cubes. Hot sauce and Tabasco sauce. Premade or packaged marinades. Premade or packaged taco seasonings. Relishes. Regular salad dressings. Where to find more information:  National Heart, Lung, and Blood Institute: www.nhlbi.nih.gov  American Heart Association: www.heart.org Summary  The DASH eating plan is a healthy eating plan that has been shown to reduce high blood pressure (hypertension). It may also reduce your risk for type 2 diabetes, heart disease, and stroke.  With the DASH eating plan, you should limit salt (sodium) intake to 2,300 mg a day. If you have hypertension, you may need to reduce your sodium intake to 1,500 mg a day.  When on the DASH eating plan, aim to eat more fresh fruits and vegetables, whole grains, lean proteins, low-fat dairy, and heart-healthy fats.  Work with your health care provider or diet and nutrition specialist (dietitian) to adjust your eating plan to your individual   calorie needs. This information is not intended to replace advice given to you by your health care provider. Make sure you discuss any questions you have with your health care provider. Document Released: 10/09/2011 Document Revised: 10/13/2016 Document Reviewed: 10/13/2016 Elsevier Interactive Patient Education  2018 [employer].  Gout Gout is painful swelling that can occur in some of your joints. Gout is a type of  arthritis. This condition is caused by having too much uric acid in your body. Uric acid is a chemical that forms when your body breaks down substances called purines. Purines are important for building body proteins. When your body has too much uric acid, sharp crystals can form and build up inside your joints. This causes pain and swelling. Gout attacks can happen quickly and be very painful (acute gout). Over time, the attacks can affect more joints and become more frequent (chronic gout). Gout can also cause uric acid to build up under your skin and inside your kidneys. What are the causes? This condition is caused by too much uric acid in your blood. This can occur because:  Your kidneys do not remove enough uric acid from your blood. This is the most common cause.  Your body makes too much uric acid. This can occur with some cancers and cancer treatments. It can also occur if your body is breaking down too many red blood cells (hemolytic anemia).  You eat too many foods that are high in purines. These foods include organ meats and some seafood. Alcohol, especially beer, is also high in purines.  A gout attack may be triggered by trauma or stress. What increases the risk? This condition is more likely to develop in people who:  Have a family history of gout.  Are male and middle-aged.  Are male and have gone through menopause.  Are obese.  Frequently drink alcohol, especially beer.  Are dehydrated.  Lose weight too quickly.  Have an organ transplant.  Have lead poisoning.  Take certain medicines, including aspirin, cyclosporine, diuretics, levodopa, and niacin.  Have kidney disease or psoriasis.  What are the signs or symptoms? An attack of acute gout happens quickly. It usually occurs in just one joint. The most common place is the big toe. Attacks often start at night. Other joints that may be affected include joints of the feet, ankle, knee, fingers, wrist, or elbow.  Symptoms may include:  Severe pain.  Warmth.  Swelling.  Stiffness.  Tenderness. The affected joint may be very painful to touch.  Shiny, red, or purple skin.  Chills and fever.  Chronic gout may cause symptoms more frequently. More joints may be involved. You may also have white or yellow lumps (tophi) on your hands or feet or in other areas near your joints. How is this diagnosed? This condition is diagnosed based on your symptoms, medical history, and physical exam. You may have tests, such as:  Blood tests to measure uric acid levels.  Removal of joint fluid with a needle (aspiration) to look for uric acid crystals.  X-rays to look for joint damage.  How is this treated? Treatment for this condition has two phases: treating an acute attack and preventing future attacks. Acute gout treatment may include medicines to reduce pain and swelling, including:  NSAIDs.  Steroids. These are strong anti-inflammatory medicines that can be taken by mouth (orally) or injected into a joint.  Colchicine. This medicine relieves pain and swelling when it is taken soon after an attack. It can be given  orally or through an IV tube.  Preventive treatment may include:  Daily use of smaller doses of NSAIDs or colchicine.  Use of a medicine that reduces uric acid levels in your blood.  Changes to your diet. You may need to see a specialist about healthy eating (dietitian).  Follow these instructions at home: During a Gout Attack  If directed, apply ice to the affected area: ? Put ice in a plastic bag. ? Place a towel between your skin and the bag. ? Leave the ice on for 20 minutes, 2-3 times a day.  Rest the joint as much as possible. If the affected joint is in your leg, you may be given crutches to use.  Raise (elevate) the affected joint above the level of your heart as often as possible.  Drink enough fluids to keep your urine clear or pale yellow.  Take over-the-counter and  prescription medicines only as told by your health care provider.  Do not drive or operate heavy machinery while taking prescription pain medicine.  Follow instructions from your health care provider about eating or drinking restrictions.  Return to your normal activities as told by your health care provider. Ask your health care provider what activities are safe for you. Avoiding Future Gout Attacks  Follow a low-purine diet as told by your dietitian or health care provider. Avoid foods and drinks that are high in purines, including liver, kidney, anchovies, asparagus, herring, mushrooms, mussels, and beer.  Limit alcohol intake to no more than 1 drink a day for nonpregnant women and 2 drinks a day for men. One drink equals 12 oz of beer, 5 oz of wine, or 1 oz of hard liquor.  Maintain a healthy weight or lose weight if you are overweight. If you want to lose weight, talk with your health care provider. It is important that you do not lose weight too quickly.  Start or maintain an exercise program as told by your health care provider.  Drink enough fluids to keep your urine clear or pale yellow.  Take over-the-counter and prescription medicines only as told by your health care provider.  Keep all follow-up visits as told by your health care provider. This is important. Contact a health care provider if:  You have another gout attack.  You continue to have symptoms of a gout attack after10 days of treatment.  You have side effects from your medicines.  You have chills or a fever.  You have burning pain when you urinate.  You have pain in your lower back or belly. Get help right away if:  You have severe or uncontrolled pain.  You cannot urinate. This information is not intended to replace advice given to you by your health care provider. Make sure you discuss any questions you have with your health care provider. Document Released: 10/17/2000 Document Revised: 03/27/2016  Document Reviewed: 08/02/2015 Elsevier Interactive Patient Education  2018 [employer].  Health Maintenance, Male A healthy lifestyle and preventive care is important for your health and wellness. Ask your health care provider about what schedule of regular examinations is right for you. What should I know about weight and diet? Eat a Healthy Diet  Eat plenty of vegetables, fruits, whole grains, low-fat dairy products, and lean protein.  Do not eat a lot of foods high in solid fats, added sugars, or salt.  Maintain a Healthy Weight Regular exercise can help you achieve or maintain a healthy weight. You should:  Do at least 150 minutes  of exercise each week. The exercise should increase your heart rate and make you sweat (moderate-intensity exercise).  Do strength-training exercises at least twice a week.  Watch Your Levels of Cholesterol and Blood Lipids  Have your blood tested for lipids and cholesterol every 5 years starting at 47 years of age. If you are at high risk for heart disease, you should start having your blood tested when you are 47 years old. You may need to have your cholesterol levels checked more often if: ? Your lipid or cholesterol levels are high. ? You are older than 47 years of age. ? You are at high risk for heart disease.  What should I know about cancer screening? Many types of cancers can be detected early and may often be prevented. Lung Cancer  You should be screened every year for lung cancer if: ? You are a current smoker who has smoked for at least 30 years. ? You are a former smoker who has quit within the past 15 years.  Talk to your health care provider about your screening options, when you should start screening, and how often you should be screened.  Colorectal Cancer  Routine colorectal cancer screening usually begins at 47 years of age and should be repeated every 5-10 years until you are 47 years old. You may need to be screened more  often if early forms of precancerous polyps or small growths are found. Your health care provider may recommend screening at an earlier age if you have risk factors for colon cancer.  Your health care provider may recommend using home test kits to check for hidden blood in the stool.  A small camera at the end of a tube can be used to examine your colon (sigmoidoscopy or colonoscopy). This checks for the earliest forms of colorectal cancer.  Prostate and Testicular Cancer  Depending on your age and overall health, your health care provider may do certain tests to screen for prostate and testicular cancer.  Talk to your health care provider about any symptoms or concerns you have about testicular or prostate cancer.  Skin Cancer  Check your skin from head to toe regularly.  Tell your health care provider about any new moles or changes in moles, especially if: ? There is a change in a mole's size, shape, or color. ? You have a mole that is larger than a pencil eraser.  Always use sunscreen. Apply sunscreen liberally and repeat throughout the day.  Protect yourself by wearing long sleeves, pants, a wide-brimmed hat, and sunglasses when outside.  What should I know about heart disease, diabetes, and high blood pressure?  If you are 54-2 years of age, have your blood pressure checked every 3-5 years. If you are 64 years of age or older, have your blood pressure checked every year. You should have your blood pressure measured twice-once when you are at a hospital or clinic, and once when you are not at a hospital or clinic. Record the average of the two measurements. To check your blood pressure when you are not at a hospital or clinic, you can use: ? An automated blood pressure machine at a pharmacy. ? A home blood pressure monitor.  Talk to your health care provider about your target blood pressure.  If you are between 35-27 years old, ask your health care provider if you should take  aspirin to prevent heart disease.  Have regular diabetes screenings by checking your fasting blood sugar level. ? If you  are at a normal weight and have a low risk for diabetes, have this test once every three years after the age of 108. ? If you are overweight and have a high risk for diabetes, consider being tested at a younger age or more often.  A one-time screening for abdominal aortic aneurysm (AAA) by ultrasound is recommended for men aged 65-75 years who are current or former smokers. What should I know about preventing infection? Hepatitis B If you have a higher risk for hepatitis B, you should be screened for this virus. Talk with your health care provider to find out if you are at risk for hepatitis B infection. Hepatitis C Blood testing is recommended for:  Everyone born from [birth year] through 1965.  Anyone with known risk factors for hepatitis C.  Sexually Transmitted Diseases (STDs)  You should be screened each year for STDs including gonorrhea and chlamydia if: ? You are sexually active and are younger than 47 years of age. ? You are older than 48 years of age and your health care provider tells you that you are at risk for this type of infection. ? Your sexual activity has changed since you were last screened and you are at an increased risk for chlamydia or gonorrhea. Ask your health care provider if you are at risk.  Talk with your health care provider about whether you are at high risk of being infected with HIV. Your health care provider may recommend a prescription medicine to help prevent HIV infection.  What else can I do?  Schedule regular health, dental, and eye exams.  Stay current with your vaccines (immunizations).  Do not use any tobacco products, such as cigarettes, chewing tobacco, and e-cigarettes. If you need help quitting, ask your health care provider.  Limit alcohol intake to no more than 2 drinks per day. One drink equals 12 ounces of beer, 5 ounces of  wine, or 1 ounces of hard liquor.  Do not use street drugs.  Do not share needles.  Ask your health care provider for help if you need support or information about quitting drugs.  Tell your health care provider if you often feel depressed.  Tell your health care provider if you have ever been abused or do not feel safe at home. This information is not intended to replace advice given to you by your health care provider. Make sure you discuss any questions you have with your health care provider. Document Released: 04/17/2008 Document Revised: 06/18/2016 Document Reviewed: 07/24/2015 Elsevier Interactive Patient Education  2018 [employer].  Major Depressive Disorder, Adult Major depressive disorder (MDD) is a mental health condition. It may also be called clinical depression or unipolar depression. MDD usually causes feelings of sadness, hopelessness, or helplessness. MDD can also cause physical symptoms. It can interfere with work, school, relationships, and other everyday activities. MDD may be mild, moderate, or severe. It may occur once (single episode major depressive disorder) or it may occur multiple times (recurrent major depressive disorder). What are the causes? The exact cause of this condition is not known. MDD is most likely caused by a combination of things, which may include:  Genetic factors. These are traits that are passed along from parent to child.  Individual factors. Your personality, your behavior, and the way you handle your thoughts and feelings may contribute to MDD. This includes personality traits and behaviors learned from others.  Physical factors, such as: ? Differences in the part of your brain that controls emotion. This  part of your brain may be different than it is in people who do not have MDD. ? Long-term (chronic) medical or psychiatric illnesses.  Social factors. Traumatic experiences or major life changes may play a role in the development of  MDD.  What increases the risk? This condition is more likely to develop in women. The following factors may also make you more likely to develop MDD:  A family history of depression.  Troubled family relationships.  Abnormally low levels of certain brain chemicals.  Traumatic events in childhood, especially abuse or the loss of a parent.  Being under a lot of stress, or long-term stress, especially from upsetting life experiences or losses.  A history of: ? Chronic physical illness. ? Other mental health disorders. ? Substance abuse.  Poor living conditions.  Experiencing social exclusion or discrimination on a regular basis.  What are the signs or symptoms? The main symptoms of MDD typically include:  Constant depressed or irritable mood.  Loss of interest in things and activities.  MDD symptoms may also include:  Sleeping or eating too much or too little.  Unexplained weight change.  Fatigue or low energy.  Feelings of worthlessness or guilt.  Difficulty thinking clearly or making decisions.  Thoughts of suicide or of harming others.  Physical agitation or weakness.  Isolation.  Severe cases of MDD may also occur with other symptoms, such as:  Delusions or hallucinations, in which you imagine things that are not real (psychotic depression).  Low-level depression that lasts at least a year (chronic depression or persistent depressive disorder).  Extreme sadness and hopelessness (melancholic depression).  Trouble speaking and moving (catatonic depression).  How is this diagnosed? This condition may be diagnosed based on:  Your symptoms.  Your medical history, including your mental health history. This may involve tests to evaluate your mental health. You may be asked questions about your lifestyle, including any drug and alcohol use, and how long you have had symptoms of MDD.  A physical exam.  Blood tests to rule out other conditions.  You must  have a depressed mood and at least four other MDD symptoms most of the day, nearly every day in the same 2-week timeframe before your health care provider can confirm a diagnosis of MDD. How is this treated? This condition is usually treated by mental health professionals, such as psychologists, psychiatrists, and clinical social workers. You may need more than one type of treatment. Treatment may include:  Psychotherapy. This is also called talk therapy or counseling. Types of psychotherapy include: ? Cognitive behavioral therapy (CBT). This type of therapy teaches you to recognize unhealthy feelings, thoughts, and behaviors, and replace them with positive thoughts and actions. ? Interpersonal therapy (IPT). This helps you to improve the way you relate to and communicate with others. ? Family therapy. This treatment includes members of your family.  Medicine to treat anxiety and depression, or to help you control certain emotions and behaviors.  Lifestyle changes, such as: ? Limiting alcohol and drug use. ? Exercising regularly. ? Getting plenty of sleep. ? Making healthy eating choices. ? Spending more time outdoors.  Treatments involving stimulation of the brain can be used in situations with extremely severe symptoms, or when medicine or other therapies do not work over time. These treatments include electroconvulsive therapy, transcranial magnetic stimulation, and vagal nerve stimulation. Follow these instructions at home: Activity  Return to your normal activities as told by your health care provider.  Exercise regularly and spend time  outdoors as told by your health care provider. General instructions  Take over-the-counter and prescription medicines only as told by your health care provider.  Do not drink alcohol. If you drink alcohol, limit your alcohol intake to no more than 1 drink a day for nonpregnant women and 2 drinks a day for men. One drink equals 12 oz of beer, 5 oz of  wine, or 1 oz of hard liquor. Alcohol can affect any antidepressant medicines you are taking. Talk to your health care provider about your alcohol use.  Eat a healthy diet and get plenty of sleep.  Find activities that you enjoy doing, and make time to do them.  Consider joining a support group. Your health care provider may be able to recommend a support group.  Keep all follow-up visits as told by your health care provider. This is important. Where to find more information: [employer] on Mental Illness  www.nami.org  [employer] of Mental Health  http://www.maynard.net/  National Suicide Prevention Lifeline  1-800-273-TALK 802-140-1128). This is free, 24-hour help.  Contact a health care provider if:  Your symptoms get worse.  You develop new symptoms. Get help right away if:  You self-harm.  You have serious thoughts about hurting yourself or others.  You see, hear, taste, smell, or feel things that are not present (hallucinate). This information is not intended to replace advice given to you by your health care provider. Make sure you discuss any questions you have with your health care provider. Document Released: 02/14/2013 Document Revised: 06/26/2016 Document Reviewed: 04/30/2016 Elsevier Interactive Patient Education  2018 [employer].  Alcohol Use Disorder Alcohol use disorder is when your drinking disrupts your daily life. When you have this condition, you drink too much alcohol and you cannot control your drinking. Alcohol use disorder can cause serious problems with your physical health. It can affect your brain, heart, liver, pancreas, immune system, stomach, and intestines. Alcohol use disorder can increase your risk for certain cancers and cause problems with your mental health, such as depression, anxiety, psychosis, delirium, and dementia. People with this disorder risk hurting themselves and others. What are the causes? This condition is caused by  drinking too much alcohol over time. It is not caused by drinking too much alcohol only one or two times. Some people with this condition drink alcohol to cope with or escape from negative life events. Others drink to relieve pain or symptoms of mental illness. What increases the risk? You are more likely to develop this condition if:  You have a family history of alcohol use disorder.  Your culture encourages drinking to the point of intoxication, or makes alcohol easy to get.  You had a mood or conduct disorder in childhood.  You have been a victim of abuse.  You are an adolescent and: ? You have poor grades or difficulties in school. ? Your caregivers do not talk to you about saying no to alcohol, or supervise your activities. ? You are impulsive or you have trouble with self-control.  What are the signs or symptoms? Symptoms of this condition include:  Drinkingmore than you want to.  Drinking for longer than you want to.  Trying several times to drink less or to control your drinking.  Spending a lot of time getting alcohol, drinking, or recovering from drinking.  Craving alcohol.  Having problems at work, at school, or at home due to drinking.  Having problems in relationships due to drinking.  Drinking when it  is dangerous to drink, such as before driving a car.  Continuing to drink even though you know you might have a physical or mental problem related to drinking.  Needing more and more alcohol to get the same effect you want from the alcohol (building up tolerance).  Having symptoms of withdrawal when you stop drinking. Symptoms of withdrawal include: ? Fatigue. ? Nightmares. ? Trouble sleeping. ? Depression. ? Anxiety. ? Fever. ? Seizures. ? Severe confusion. ? Feeling or seeing things that are not there (hallucinations). ? Tremors. ? Rapid heart rate. ? Rapid breathing. ? High blood pressure.  Drinking to avoid symptoms of withdrawal.  How is this  diagnosed? This condition is diagnosed with an assessment. Your health care provider may start the assessment by asking three or four questions about your drinking. Your health care provider may perform a physical exam or do lab tests to see if you have physical problems resulting from alcohol use. She or he may refer you to a mental health professional for evaluation. How is this treated? Some people with alcohol use disorder are able to reduce their alcohol use to low-risk levels. Others need to completely quit drinking alcohol. When necessary, mental health professionals with specialized training in substance use treatment can help. Your health care provider can help you decide how severe your alcohol use disorder is and what type of treatment you need. The following forms of treatment are available:  Detoxification. Detoxification involves quitting drinking and using prescription medicines within the first week to help lessen withdrawal symptoms. This treatment is important for people who have had withdrawal symptoms before and for heavy drinkers who are likely to have withdrawal symptoms. Alcohol withdrawal can be dangerous, and in severe cases, it can cause death. Detoxification may be provided in a home, community, or primary care setting, or in a hospital or substance use treatment facility.  Counseling. This treatment is also called talk therapy. It is provided by substance use treatment counselors. A counselor can address the reasons you use alcohol and suggest ways to keep you from drinking again or to prevent problem drinking. The goals of talk therapy are to: ? Find healthy activities and ways for you to cope with stress. ? Identify and avoid the things that trigger your alcohol use. ? Help you learn how to handle cravings.  Medicines.Medicines can help treat alcohol use disorder by: ? Decreasing alcohol cravings. ? Decreasing the positive feeling you have when you drink  alcohol. ? Causing an uncomfortable physical reaction when you drink alcohol (aversion therapy).  Support groups. Support groups are led by people who have quit drinking. They provide emotional support, advice, and guidance.  These forms of treatment are often combined. Some people with this condition benefit from a combination of treatments provided by specialized substance use treatment centers. Follow these instructions at home:  Take over-the-counter and prescription medicines only as told by your health care provider.  Check with your health care provider before starting any new medicines.  Ask friends and family members not to offer you alcohol.  Avoid situations where alcohol is served, including gatherings where others are drinking alcohol.  Create a plan for what to do when you are tempted to use alcohol.  Find hobbies or activities that you enjoy that do not include alcohol.  Keep all follow-up visits as told by your health care provider. This is important. How is this prevented?  If you drink, limit alcohol intake to no more than 1 drink a  day for nonpregnant women and 2 drinks a day for men. One drink equals 12 oz of beer, 5 oz of wine, or 1 oz of hard liquor.  If you have a mental health condition, get treatment and support.  Do not give alcohol to adolescents.  If you are an adolescent: ? Do not drink alcohol. ? Do not be afraid to say no if someone offers you alcohol. Speak up about why you do not want to drink. You can be a positive role model for your friends and set a good example for those around you by not drinking alcohol. ? If your friends drink, spend time with others who do not drink alcohol. Make new friends who do not use alcohol. ? Find healthy ways to manage stress and emotions, such as meditation or deep breathing, exercise, spending time in nature, listening to music, or talking with a trusted friend or family member. Contact a health care provider  if:  You are not able to take your medicines as told.  Your symptoms get worse.  You return to drinking alcohol (relapse) and your symptoms get worse. Get help right away if:  You have thoughts about hurting yourself or others. If you ever feel like you may hurt yourself or others, or have thoughts about taking your own life, get help right away. You can go to your nearest emergency department or call:  Your local emergency services (911 in the [place]  A suicide crisis helpline, such as the National Suicide Prevention Lifeline at (804) 284-8098. This is open 24 hours a day.  Summary  Alcohol use disorder is when your drinking disrupts your daily life. When you have this condition, you drink too much alcohol and you cannot control your drinking.  Treatment may include detoxification, counseling, medicine, and support groups.  Ask friends and family members not to offer you alcohol. Avoid situations where alcohol is served.  Get help right away if you have thoughts about hurting yourself or others. This information is not intended to replace advice given to you by your health care provider. Make sure you discuss any questions you have with your health care provider. Document Released: 11/27/2004 Document Revised: 07/17/2016 Document Reviewed: 07/17/2016 Elsevier Interactive Patient Education  [employer].

## 2018-05-17 ENCOUNTER — Encounter: Payer: Self-pay | Admitting: Family Medicine

## 2018-05-18 ENCOUNTER — Encounter: Payer: Self-pay | Admitting: Family Medicine

## 2018-06-14 ENCOUNTER — Ambulatory Visit: Payer: 59 | Admitting: Family Medicine

## 2018-06-14 ENCOUNTER — Encounter: Payer: Self-pay | Admitting: Family Medicine

## 2018-06-14 VITALS — BP 130/80 | HR 68 | Ht 73.0 in | Wt 211.0 lb

## 2018-06-14 DIAGNOSIS — I10 Essential (primary) hypertension: Secondary | ICD-10-CM | POA: Diagnosis not present

## 2018-06-14 DIAGNOSIS — F339 Major depressive disorder, recurrent, unspecified: Secondary | ICD-10-CM | POA: Diagnosis not present

## 2018-06-14 DIAGNOSIS — R7989 Other specified abnormal findings of blood chemistry: Secondary | ICD-10-CM

## 2018-06-14 DIAGNOSIS — Z8739 Personal history of other diseases of the musculoskeletal system and connective tissue: Secondary | ICD-10-CM

## 2018-06-14 DIAGNOSIS — E7800 Pure hypercholesterolemia, unspecified: Secondary | ICD-10-CM | POA: Insufficient documentation

## 2018-06-14 DIAGNOSIS — R945 Abnormal results of liver function studies: Secondary | ICD-10-CM

## 2018-06-14 MED ORDER — LOSARTAN POTASSIUM-HCTZ 100-12.5 MG PO TABS: 1.0000 | ORAL_TABLET | Freq: Every day | ORAL | 1 refills | Status: DC

## 2018-06-14 MED ORDER — PAROXETINE HCL ER 25 MG PO TB24: 25.0000 mg | ORAL_TABLET | Freq: Every day | ORAL | 0 refills | Status: DC

## 2018-06-14 NOTE — Progress Notes (Signed)
Subjective:  Patient ID: John Singh, male    DOB: [birth year]  Age: 47 y.o. MRN: [id number]  CC: Follow-up   HPI John BalesJamie Singh presents for follow-up of his depression and the lab work-up.  Patient feels as though he is doing better with regards to his depression taking the Paxil.  He noticed an improvement after he increase the dose to 25 mg daily.  His wife and kids told him that he thought he was doing better on the drug as well.  With regards to his blood work he reminds me that he was nonfasting for it.  Triglycerides and glucose were minimally elevated.  LDL was 53.  Liver enzymes were elevated.  Outpatient Medications Prior to Visit  Medication Sig Dispense Refill  . colchicine 0.6 MG tablet Take 1 tablet (0.6 mg total) by mouth daily. (Patient taking differently: Take 0.6 mg by mouth daily as needed (for gout flares). ) 30 tablet 0  . losartan-hydrochlorothiazide (HYZAAR) 100-12.5 MG tablet Take 1 tablet by mouth daily.    Marland Kitchen. PARoxetine (PAXIL-CR) 12.5 MG 24 hr tablet Take pill daily for one week and then increase to 2 pills daily 60 tablet 0   No facility-administered medications prior to visit.     ROS Review of Systems  Constitutional: Negative.   Respiratory: Negative.   Cardiovascular: Negative.   Gastrointestinal: Negative.   Skin: Negative for pallor and rash.  Psychiatric/Behavioral: Positive for dysphoric mood. The patient is nervous/anxious.     Objective:  BP 130/80   Pulse 68   Ht 6\' 1"  (1.854 m)   Wt 211 lb (95.7 kg)   SpO2 98%   BMI 27.84 kg/m   BP Readings from Last 3 Encounters:  06/14/18 130/80  05/14/18 130/80  02/27/17 (!) 161/113    Wt Readings from Last 3 Encounters:  06/14/18 211 lb (95.7 kg)  05/14/18 212 lb 2 oz (96.2 kg)  02/27/17 217 lb 8 oz (98.7 kg)    Physical Exam  Constitutional: He is oriented to person, place, and time. He appears well-developed and well-nourished. No distress.  HENT:  Head: Normocephalic and atraumatic.    Right Ear: External ear normal.  Left Ear: External ear normal.  Eyes: Right eye exhibits no discharge. Left eye exhibits no discharge. No scleral icterus.  Pulmonary/Chest: Effort normal.  Neurological: He is alert and oriented to person, place, and time.  Skin: Skin is warm and dry.  Psychiatric: He has a normal mood and affect. His behavior is normal.   Depression screen [facility] 2/9 06/14/2018 05/14/2018 01/16/2016  Decreased Interest 1 2 0  Down, Depressed, Hopeless 0 1 0  PHQ - 2 Score 1 3 0  Altered sleeping 1 2 -  Tired, decreased energy 1 1 -  Change in appetite 0 1 -  Feeling bad or failure about yourself  1 1 -  Trouble concentrating 1 0 -  Moving slowly or fidgety/restless 0 0 -  Suicidal thoughts 0 1 -  PHQ-9 Score 5 9 -     Lab Results  Component Value Date   WBC 7.0 05/14/2018   HGB 16.5 05/14/2018   HCT 47.3 05/14/2018   PLT 289.0 05/14/2018   GLUCOSE 103 (H) 05/14/2018   CHOL 233 (H) 05/14/2018   TRIG 157.0 (H) 05/14/2018   HDL 47.90 05/14/2018   LDLCALC 153 (H) 05/14/2018   ALT 58 (H) 05/14/2018   AST 59 (H) 05/14/2018   NA 139 05/14/2018   K 4.0 05/14/2018  CL 103 05/14/2018   CREATININE 1.10 05/14/2018   BUN 11 05/14/2018   CO2 26 05/14/2018   INR 0.9 11/30/2007   MICROALBUR 1.3 05/14/2018    No results found.  Assessment & Plan:   [name] was seen today for follow-up.  Diagnoses and all orders for this visit:  Essential hypertension -     losartan-hydrochlorothiazide (HYZAAR) 100-12.5 MG tablet; Take 1 tablet by mouth daily.  Depression, recurrent (HCC) -     PARoxetine (PAXIL-CR) 25 MG 24 hr tablet; Take 1 tablet (25 mg total) by mouth daily.  History of avascular necrosis of capital femoral epiphysis  Elevated LFTs  Elevated LDL cholesterol level   I have discontinued [name]'s PARoxetine. I am also having him start on PARoxetine. Additionally, I am having him maintain his colchicine and losartan-hydrochlorothiazide.  Meds  ordered this encounter  Medications  . losartan-hydrochlorothiazide (HYZAAR) 100-12.5 MG tablet    Sig: Take 1 tablet by mouth daily.    Dispense:  90 tablet    Refill:  1  . PARoxetine (PAXIL-CR) 25 MG 24 hr tablet    Sig: Take 1 tablet (25 mg total) by mouth daily.    Dispense:  90 tablet    Refill:  0   Patient seems to be responding to the Paxil nicely.  He has an I am concerned about his elevated liver enzymes.  He has been able to stop drinking for a period of time but not consistently.  Suggested that he consider AA.  He is aware of how to go about finding meetings but is never been.  He will follow-up in 3 months or sooner if needed.  Follow-up: Return in about 3 months (around 09/14/2018).  Mliss SaxWilliam Alfred Kremer, MD

## 2018-08-17 ENCOUNTER — Telehealth (HOSPITAL_COMMUNITY): Payer: Self-pay | Admitting: Psychology

## 2018-09-14 ENCOUNTER — Ambulatory Visit: Payer: 59 | Admitting: Family Medicine

## 2018-09-14 ENCOUNTER — Encounter: Payer: Self-pay | Admitting: Family Medicine

## 2018-09-14 VITALS — BP 126/80 | HR 77 | Ht 73.0 in | Wt 216.0 lb

## 2018-09-14 DIAGNOSIS — R945 Abnormal results of liver function studies: Secondary | ICD-10-CM

## 2018-09-14 DIAGNOSIS — F339 Major depressive disorder, recurrent, unspecified: Secondary | ICD-10-CM

## 2018-09-14 DIAGNOSIS — E7800 Pure hypercholesterolemia, unspecified: Secondary | ICD-10-CM | POA: Diagnosis not present

## 2018-09-14 DIAGNOSIS — I10 Essential (primary) hypertension: Secondary | ICD-10-CM

## 2018-09-14 DIAGNOSIS — R7989 Other specified abnormal findings of blood chemistry: Secondary | ICD-10-CM

## 2018-09-14 LAB — HEPATIC FUNCTION PANEL
ALBUMIN: 4.4 g/dL (ref 3.5–5.2)
ALT: 26 U/L (ref 0–53)
AST: 20 U/L (ref 0–37)
Alkaline Phosphatase: 101 U/L (ref 39–117)
Bilirubin, Direct: 0.1 mg/dL (ref 0.0–0.3)
TOTAL PROTEIN: 7.5 g/dL (ref 6.0–8.3)
Total Bilirubin: 0.5 mg/dL (ref 0.2–1.2)

## 2018-09-14 LAB — LDL CHOLESTEROL, DIRECT: LDL DIRECT: 147 mg/dL

## 2018-09-14 LAB — BASIC METABOLIC PANEL
BUN: 18 mg/dL (ref 6–23)
CHLORIDE: 103 meq/L (ref 96–112)
CO2: 29 meq/L (ref 19–32)
Calcium: 10 mg/dL (ref 8.4–10.5)
Creatinine, Ser: 1.16 mg/dL (ref 0.40–1.50)
GFR: 71.77 mL/min (ref >60.00)
Glucose, Bld: 100 mg/dL — ABNORMAL HIGH (ref 70–99)
Potassium: 4.6 mEq/L (ref 3.5–5.1)
SODIUM: 140 meq/L (ref 135–145)

## 2018-09-14 MED ORDER — LOSARTAN POTASSIUM-HCTZ 100-12.5 MG PO TABS: 1.0000 | ORAL_TABLET | Freq: Every day | ORAL | 1 refills | Status: DC

## 2018-09-14 MED ORDER — PAROXETINE HCL ER 25 MG PO TB24: 25.0000 mg | ORAL_TABLET | Freq: Every day | ORAL | 1 refills | Status: DC

## 2018-09-14 NOTE — Progress Notes (Addendum)
Subjective:  Patient ID: John Singh, male    DOB: 21-Aug-1971  Age: 47 y.o. MRN: [id number]  CC: Follow-up   HPI Govanni Singh presents for follow-up of his hypertension, depression and elevated LDL cholesterol.  Blood pressure is been controlled on his current therapy and he is having no issues taking the medicine.  Feels much better on the Paxil and his family concurs.  He has moderated his alcohol intake but admits that he fell off the wagon about 3 weeks ago.  CT of liver back in 2017 was normal.  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  Denies recent gouty attacks.  Says that they usually come once a year.  He has had up to 3 and 1 year.  They are responding to the colchicine as needed at this point.  Outpatient Medications Prior to Visit  Medication Sig Dispense Refill  . colchicine 0.6 MG tablet Take 1 tablet (0.6 mg total) by mouth daily. (Patient taking differently: Take 0.6 mg by mouth daily as needed (for gout flares). ) 30 tablet 0  . PARoxetine (PAXIL-CR) 25 MG 24 hr tablet Take 1 tablet (25 mg total) by mouth daily. 90 tablet 0  . losartan-hydrochlorothiazide (HYZAAR) 100-12.5 MG tablet Take 1 tablet by mouth daily. 90 tablet 1   No facility-administered medications prior to visit.     ROS Review of Systems  Constitutional: Negative.   Eyes: Negative for photophobia and visual disturbance.  Respiratory: Negative.   Cardiovascular: Negative.   Gastrointestinal: Negative.   Endocrine: Negative for polyphagia and polyuria.  Musculoskeletal: Negative for arthralgias, gait problem and joint swelling.  Neurological: Negative for speech difficulty and light-headedness.  Hematological: Does not bruise/bleed easily.  Psychiatric/Behavioral: Negative for self-injury and suicidal ideas. The patient is not nervous/anxious and is not hyperactive.     Objective:  BP 126/80 (BP Location: Left Arm, Patient Position: Sitting, Cuff Size: Normal)   Pulse 77    Ht 6\' 1"  (1.854 m)   Wt 216 lb (98 kg)   SpO2 99%   BMI 28.50 kg/m   BP Readings from Last 3 Encounters:  09/14/18 126/80  06/14/18 130/80  05/14/18 130/80    Wt Readings from Last 3 Encounters:  09/14/18 216 lb (98 kg)  06/14/18 211 lb (95.7 kg)  05/14/18 212 lb 2 oz (96.2 kg)    Physical Exam  Constitutional: He is oriented to person, place, and time. He appears well-developed and well-nourished. No distress.  HENT:  Head: Normocephalic and atraumatic.  Right Ear: External ear normal.  Left Ear: External ear normal.  Mouth/Throat: Oropharynx is clear and moist. No oropharyngeal exudate.  Eyes: Pupils are equal, round, and reactive to light. Conjunctivae and EOM are normal. Right eye exhibits no discharge. Left eye exhibits no discharge. No scleral icterus.  Neck: Neck supple. No JVD present. No tracheal deviation present. No thyromegaly present.  Cardiovascular: Normal rate, regular rhythm and normal heart sounds.  Pulmonary/Chest: Effort normal and breath sounds normal.  Lymphadenopathy:    He has no cervical adenopathy.  Neurological: He is alert and oriented to person, place, and time.  Skin: Skin is warm and dry. He is not diaphoretic.  Psychiatric: He has a normal mood and affect. His behavior is normal.   The 10-year ASCVD risk score Denman George DC Jr., et al., 2013) is: 3.5%   Values used to calculate the score:     Age: 2 years     Sex: Male  Is Non-Hispanic African American: No     Diabetic: No     Tobacco smoker: No     Systolic Blood Pressure: 126 mmHg     Is BP treated: Yes     HDL Cholesterol: 47.9 mg/dL     Total Cholesterol: 233 mg/dL Lab Results  Component Value Date   WBC 7.0 05/14/2018   HGB 16.5 05/14/2018   HCT 47.3 05/14/2018   PLT 289.0 05/14/2018   GLUCOSE 103 (H) 05/14/2018   CHOL 233 (H) 05/14/2018   TRIG 157.0 (H) 05/14/2018   HDL 47.90 05/14/2018   LDLCALC 153 (H) 05/14/2018   ALT 58 (H) 05/14/2018   AST 59 (H) 05/14/2018   NA 139  05/14/2018   K 4.0 05/14/2018   CL 103 05/14/2018   CREATININE 1.10 05/14/2018   BUN 11 05/14/2018   CO2 26 05/14/2018   INR 0.9 11/30/2007   MICROALBUR 1.3 05/14/2018    No results found.  Assessment & Plan:   [name] was seen today for follow-up.  Diagnoses and all orders for this visit:  Essential hypertension -     Basic metabolic panel -     losartan-hydrochlorothiazide (HYZAAR) 100-12.5 MG tablet; Take 1 tablet by mouth daily.  Depression, recurrent (HCC) -     PARoxetine (PAXIL-CR) 25 MG 24 hr tablet; Take 1 tablet (25 mg total) by mouth daily.  Elevated LFTs -     Hepatic function panel  Elevated LDL cholesterol level -     LDL cholesterol, direct   I am having Cheikh Gibb maintain his colchicine, losartan-hydrochlorothiazide, and PARoxetine.  Meds ordered this encounter  Medications  . losartan-hydrochlorothiazide (HYZAAR) 100-12.5 MG tablet    Sig: Take 1 tablet by mouth daily.    Dispense:  90 tablet    Refill:  1  . PARoxetine (PAXIL-CR) 25 MG 24 hr tablet    Sig: Take 1 tablet (25 mg total) by mouth daily.    Dispense:  90 tablet    Refill:  1   Patient will continue above medicines.  Advised continued alcohol moderation.  Discussed liver elastography in lieu of a biopsy be considered with elevating these.  Information given on lowering the fat and cholesterol in the diet.  Follow-up in 6 months.  Follow-up: Return in about 6 months (around 03/15/2019), or if symptoms worsen or fail to improve.  Mliss SaxWilliam Alfred Betsy Rosello, MD

## 2018-09-14 NOTE — Patient Instructions (Signed)
Fat and Cholesterol Restricted Diet Getting too much fat and cholesterol in your diet may cause health problems. Following this diet helps keep your fat and cholesterol at normal levels. This can keep you from getting sick. What types of fat should I choose?  Choose monosaturated and polyunsaturated fats. These are found in foods such as olive oil, canola oil, flaxseeds, walnuts, almonds, and seeds.  Eat more omega-3 fats. Good choices include salmon, mackerel, sardines, tuna, flaxseed oil, and ground flaxseeds.  Limit saturated fats. These are in animal products such as meats, butter, and cream. They can also be in plant products such as palm oil, palm kernel oil, and coconut oil.  Avoid foods with partially hydrogenated oils in them. These contain trans fats. Examples of foods that have trans fats are stick margarine, some tub margarines, cookies, crackers, and other baked goods. What general guidelines do I need to follow?  Check food labels. Look for the words "trans fat" and "saturated fat."  When preparing a meal: ? Fill half of your plate with vegetables and green salads. ? Fill one fourth of your plate with whole grains. Look for the word "whole" as the first word in the ingredient list. ? Fill one fourth of your plate with lean protein foods.  Eat more foods that have fiber, like apples, carrots, beans, peas, and barley.  Eat more home-cooked foods. Eat less at restaurants and buffets.  Limit or avoid alcohol.  Limit foods high in starch and sugar.  Limit fried foods.  Cook foods without frying them. Baking, boiling, grilling, and broiling are all great options.  Lose weight if you are overweight. Losing even a small amount of weight can help your overall health. It can also help prevent diseases such as diabetes and heart disease. What foods can I eat? Grains Whole grains, such as whole wheat or whole grain breads, crackers, cereals, and pasta. Unsweetened oatmeal,  bulgur, barley, quinoa, or brown rice. Corn or whole wheat flour tortillas. Vegetables Fresh or frozen vegetables (raw, steamed, roasted, or grilled). Green salads. Fruits All fresh, canned (in natural juice), or frozen fruits. Meat and Other Protein Products Ground beef (85% or leaner), grass-fed beef, or beef trimmed of fat. Skinless chicken or turkey. Ground chicken or turkey. Pork trimmed of fat. All fish and seafood. Eggs. Dried beans, peas, or lentils. Unsalted nuts or seeds. Unsalted canned or dry beans. Dairy Low-fat dairy products, such as skim or 1% milk, 2% or reduced-fat cheeses, low-fat ricotta or cottage cheese, or plain low-fat yogurt. Fats and Oils Tub margarines without trans fats. Light or reduced-fat mayonnaise and salad dressings. Avocado. Olive, canola, sesame, or safflower oils. Natural peanut or almond butter (choose ones without added sugar and oil). The items listed above may not be a complete list of recommended foods or beverages. Contact your dietitian for more options. What foods are not recommended? Grains White bread. White pasta. White rice. Cornbread. Bagels, pastries, and croissants. Crackers that contain trans fat. Vegetables White potatoes. Corn. Creamed or fried vegetables. Vegetables in a cheese sauce. Fruits Dried fruits. Canned fruit in light or heavy syrup. Fruit juice. Meat and Other Protein Products Fatty cuts of meat. Ribs, chicken wings, bacon, sausage, bologna, salami, chitterlings, fatback, hot dogs, bratwurst, and packaged luncheon meats. Liver and organ meats. Dairy Whole or 2% milk, cream, half-and-half, and cream cheese. Whole milk cheeses. Whole-fat or sweetened yogurt. Full-fat cheeses. Nondairy creamers and whipped toppings. Processed cheese, cheese spreads, or cheese curds. Sweets and Desserts Corn   syrup, sugars, honey, and molasses. Candy. Jam and jelly. Syrup. Sweetened cereals. Cookies, pies, cakes, donuts, muffins, and ice  cream. Fats and Oils Butter, stick margarine, lard, shortening, ghee, or bacon fat. Coconut, palm kernel, or palm oils. Beverages Alcohol. Sweetened drinks (such as sodas, lemonade, and fruit drinks or punches). The items listed above may not be a complete list of foods and beverages to avoid. Contact your dietitian for more information. This information is not intended to replace advice given to you by your health care provider. Make sure you discuss any questions you have with your health care provider. Document Released: 04/20/2012 Document Revised: 06/26/2016 Document Reviewed: 01/19/2014 Elsevier Interactive Patient Education  2018 [employer]  

## 2019-02-16 DIAGNOSIS — L84 Corns and callosities: Secondary | ICD-10-CM | POA: Insufficient documentation

## 2019-02-16 DIAGNOSIS — B353 Tinea pedis: Secondary | ICD-10-CM | POA: Diagnosis not present

## 2019-02-16 DIAGNOSIS — M2041 Other hammer toe(s) (acquired), right foot: Secondary | ICD-10-CM | POA: Diagnosis not present

## 2019-03-04 DIAGNOSIS — Z89421 Acquired absence of other right toe(s): Secondary | ICD-10-CM | POA: Diagnosis not present

## 2019-03-04 DIAGNOSIS — M2031 Hallux varus (acquired), right foot: Secondary | ICD-10-CM | POA: Diagnosis not present

## 2019-03-04 DIAGNOSIS — M2041 Other hammer toe(s) (acquired), right foot: Secondary | ICD-10-CM | POA: Diagnosis not present

## 2019-03-15 ENCOUNTER — Ambulatory Visit: Payer: 59 | Admitting: Family Medicine

## 2019-03-21 DIAGNOSIS — G621 Alcoholic polyneuropathy: Secondary | ICD-10-CM | POA: Insufficient documentation

## 2019-04-03 ENCOUNTER — Other Ambulatory Visit: Payer: Self-pay | Admitting: Family Medicine

## 2019-04-03 DIAGNOSIS — F339 Major depressive disorder, recurrent, unspecified: Secondary | ICD-10-CM

## 2019-04-04 NOTE — Telephone Encounter (Signed)
Needs OV and follow up.

## 2019-11-07 ENCOUNTER — Other Ambulatory Visit: Payer: Self-pay | Admitting: Family Medicine

## 2019-11-07 DIAGNOSIS — F339 Major depressive disorder, recurrent, unspecified: Secondary | ICD-10-CM

## 2019-11-08 NOTE — Telephone Encounter (Signed)
Called patient to schedule appointment for a follow up on medications and to go over refill request. No answer unable to LM, will call back.

## 2019-11-09 NOTE — Telephone Encounter (Signed)
Pt has an appointment scheduled for follow up on medications on 11/11/19

## 2019-11-11 ENCOUNTER — Encounter: Payer: Self-pay | Admitting: Family Medicine

## 2019-11-11 ENCOUNTER — Other Ambulatory Visit: Payer: Self-pay

## 2019-11-11 ENCOUNTER — Ambulatory Visit (INDEPENDENT_AMBULATORY_CARE_PROVIDER_SITE_OTHER): Payer: 59 | Admitting: Family Medicine

## 2019-11-11 VITALS — Ht 73.0 in | Wt 213.0 lb

## 2019-11-11 DIAGNOSIS — Z789 Other specified health status: Secondary | ICD-10-CM

## 2019-11-11 DIAGNOSIS — I10 Essential (primary) hypertension: Secondary | ICD-10-CM

## 2019-11-11 DIAGNOSIS — F339 Major depressive disorder, recurrent, unspecified: Secondary | ICD-10-CM

## 2019-11-11 DIAGNOSIS — Z7289 Other problems related to lifestyle: Secondary | ICD-10-CM | POA: Diagnosis not present

## 2019-11-11 MED ORDER — LOSARTAN POTASSIUM-HCTZ 100-12.5 MG PO TABS: 1.0000 | ORAL_TABLET | Freq: Every day | ORAL | 0 refills | Status: DC

## 2019-11-11 MED ORDER — PAROXETINE HCL ER 25 MG PO TB24: ORAL_TABLET | ORAL | 0 refills | Status: DC

## 2019-11-11 NOTE — Progress Notes (Signed)
Established Patient Office Visit  Subjective:  Patient ID: John Singh, male    DOB: [birth year]  Age: 49 y.o. MRN: [id number]  CC:  Chief Complaint  Patient presents with  . Follow-up    refill on medications, no concerns     HPI John Singh presents for follow-up of his hypertension and depression.  Last seen in November 2019.  He had been out of his medicines till a few days ago when the pharmacist loaned him some pills until he can see me.  He feels as though his life was doing better while he had been taking Paxil.  He had done well with Hyzaar.  Upon stopping it his blood pressures did slightly increase.  Things are well at home he tells me.  He is living with his wife and 2 children.  Past Medical History:  Diagnosis Date  . Degenerative joint disease (DJD) of hip   . Gout   . Hypertension     Past Surgical History:  Procedure Laterality Date  . JOINT REPLACEMENT    . TOTAL HIP ARTHROPLASTY      Family History  Problem Relation Age of Onset  . Heart disease Mother   . Alcohol abuse Father   . Alcohol abuse Brother   . Drug abuse Brother     Social History   Socioeconomic History  . Marital status: Single    Spouse name: Not on file  . Number of children: Not on file  . Years of education: Not on file  . Highest education level: Not on file  Occupational History  . Not on file  Tobacco Use  . Smoking status: Never Smoker  . Smokeless tobacco: Never Used  Substance and Sexual Activity  . Alcohol use: Yes    Comment: occassional  . Drug use: Yes    Types: Marijuana  . Sexual activity: Not on file  Other Topics Concern  . Not on file  Social History Narrative  . Not on file   Social Determinants of Health   Financial Resource Strain:   . Difficulty of Paying Living Expenses: Not on file  Food Insecurity:   . Worried About Programme researcher, broadcasting/film/video in the Last Year: Not on file  . Ran Out of Food in the Last Year: Not on file  Transportation Needs:    . Lack of Transportation (Medical): Not on file  . Lack of Transportation (Non-Medical): Not on file  Physical Activity:   . Days of Exercise per Week: Not on file  . Minutes of Exercise per Session: Not on file  Stress:   . Feeling of Stress : Not on file  Social Connections:   . Frequency of Communication with Friends and Family: Not on file  . Frequency of Social Gatherings with Friends and Family: Not on file  . Attends Religious Services: Not on file  . Active Member of Clubs or Organizations: Not on file  . Attends Banker Meetings: Not on file  . Marital Status: Not on file  Intimate Partner Violence:   . Fear of Current or Ex-Partner: Not on file  . Emotionally Abused: Not on file  . Physically Abused: Not on file  . Sexually Abused: Not on file    Outpatient Medications Prior to Visit  Medication Sig Dispense Refill  . PARoxetine (PAXIL-CR) 25 MG 24 hr tablet TAKE 1 TABLET(25 MG) BY MOUTH DAILY 90 tablet 0  . colchicine 0.6 MG tablet Take 1 tablet (0.6 mg  total) by mouth daily. (Patient taking differently: Take 0.6 mg by mouth daily as needed (for gout flares). ) 30 tablet 0  . losartan-hydrochlorothiazide (HYZAAR) 100-12.5 MG tablet Take 1 tablet by mouth daily. 90 tablet 1   No facility-administered medications prior to visit.    No Known Allergies  ROS Review of Systems  Constitutional: Negative.   Respiratory: Negative.   Cardiovascular: Negative.   Gastrointestinal: Negative.   Neurological: Negative for headaches.  Hematological: Negative.   Psychiatric/Behavioral: Positive for dysphoric mood. Negative for self-injury and suicidal ideas. The patient is nervous/anxious.       Objective:    Physical Exam  Constitutional: He is oriented to person, place, and time. He appears well-developed and well-nourished. No distress.  HENT:  Head: Normocephalic and atraumatic.  Right Ear: External ear normal.  Left Ear: External ear normal.   Pulmonary/Chest: Effort normal.  Neurological: He is alert and oriented to person, place, and time.  Skin: He is not diaphoretic.  Psychiatric: He has a normal mood and affect. His behavior is normal.    Ht 6\' 1"  (1.854 m)   Wt 213 lb (96.6 kg)   BMI 28.10 kg/m  Wt Readings from Last 3 Encounters:  11/11/19 213 lb (96.6 kg)  09/14/18 216 lb (98 kg)  06/14/18 211 lb (95.7 kg)     Health Maintenance Due  Topic Date Due  . HIV Screening  10/28/1986  . TETANUS/TDAP  10/28/1990  . INFLUENZA VACCINE  06/04/2019    There are no preventive care reminders to display for this patient.  No results found for: TSH Lab Results  Component Value Date   WBC 7.0 05/14/2018   HGB 16.5 05/14/2018   HCT 47.3 05/14/2018   MCV 94.2 05/14/2018   PLT 289.0 05/14/2018   Lab Results  Component Value Date   NA 140 09/14/2018   K 4.6 09/14/2018   CO2 29 09/14/2018   GLUCOSE 100 (H) 09/14/2018   BUN 18 09/14/2018   CREATININE 1.16 09/14/2018   BILITOT 0.5 09/14/2018   ALKPHOS 101 09/14/2018   AST 20 09/14/2018   ALT 26 09/14/2018   PROT 7.5 09/14/2018   ALBUMIN 4.4 09/14/2018   CALCIUM 10.0 09/14/2018   ANIONGAP 9 02/17/2017   GFR 71.77 09/14/2018   Lab Results  Component Value Date   CHOL 233 (H) 05/14/2018   Lab Results  Component Value Date   HDL 47.90 05/14/2018   Lab Results  Component Value Date   LDLCALC 153 (H) 05/14/2018   Lab Results  Component Value Date   TRIG 157.0 (H) 05/14/2018   Lab Results  Component Value Date   CHOLHDL 5 05/14/2018   No results found for: HGBA1C    Assessment & Plan:   Problem List Items Addressed This Visit      Cardiovascular and Mediastinum   Essential hypertension - Primary   Relevant Medications   losartan-hydrochlorothiazide (HYZAAR) 100-12.5 MG tablet     Other   Alcohol use   Depression, recurrent (HCC)   Relevant Medications   PARoxetine (PAXIL-CR) 25 MG 24 hr tablet      Meds ordered this encounter   Medications  . losartan-hydrochlorothiazide (HYZAAR) 100-12.5 MG tablet    Sig: Take 1 tablet by mouth daily.    Dispense:  90 tablet    Refill:  0  . PARoxetine (PAXIL-CR) 25 MG 24 hr tablet    Sig: TAKE 1 TABLET(25 MG) BY MOUTH DAILY    Dispense:  90 tablet  Refill:  0    Follow-up: Return in about 5 weeks (around 12/16/2019).  Patient will restart his medications and follow-up with me in the next 5 weeks or so. Mliss Sax, MD   Virtual Visit via Video Note  I connected with John Singh on 11/11/19 at  2:00 PM EST by a video enabled telemedicine application and verified that I am speaking with the correct person using two identifiers.  Location: Patient: at work alone in his office. Provider:    I discussed the limitations of evaluation and management by telemedicine and the availability of in person appointments. The patient expressed understanding and agreed to proceed.  History of Present Illness:    Observations/Objective:   Assessment and Plan:   Follow Up Instructions:    I discussed the assessment and treatment plan with the patient. The patient was provided an opportunity to ask questions and all were answered. The patient agreed with the plan and demonstrated an understanding of the instructions.   The patient was advised to call back or seek an in-person evaluation if the symptoms worsen or if the condition fails to improve as anticipated.  I provided 23 minutes of non-face-to-face time during this encounter.   Mliss Sax, MD

## 2019-12-29 ENCOUNTER — Other Ambulatory Visit: Payer: Self-pay

## 2019-12-30 ENCOUNTER — Ambulatory Visit: Payer: 59 | Admitting: Family Medicine

## 2019-12-30 ENCOUNTER — Encounter: Payer: Self-pay | Admitting: Family Medicine

## 2019-12-30 VITALS — BP 112/78 | HR 73 | Temp 98.1°F | Ht 73.0 in | Wt 210.4 lb

## 2019-12-30 DIAGNOSIS — E7800 Pure hypercholesterolemia, unspecified: Secondary | ICD-10-CM | POA: Diagnosis not present

## 2019-12-30 DIAGNOSIS — I10 Essential (primary) hypertension: Secondary | ICD-10-CM | POA: Diagnosis not present

## 2019-12-30 DIAGNOSIS — M109 Gout, unspecified: Secondary | ICD-10-CM

## 2019-12-30 DIAGNOSIS — F339 Major depressive disorder, recurrent, unspecified: Secondary | ICD-10-CM

## 2019-12-30 DIAGNOSIS — T887XXA Unspecified adverse effect of drug or medicament, initial encounter: Secondary | ICD-10-CM | POA: Insufficient documentation

## 2019-12-30 MED ORDER — LOSARTAN POTASSIUM 100 MG PO TABS: 100.0000 mg | ORAL_TABLET | Freq: Every day | ORAL | 3 refills | Status: DC

## 2019-12-30 MED ORDER — INDOMETHACIN 50 MG PO CAPS: 50.0000 mg | ORAL_CAPSULE | Freq: Three times a day (TID) | ORAL | 0 refills | Status: DC | PRN

## 2019-12-30 MED ORDER — COLCHICINE 0.6 MG PO TABS: 0.6000 mg | ORAL_TABLET | Freq: Every day | ORAL | 0 refills | Status: AC

## 2019-12-30 MED ORDER — PAROXETINE HCL ER 37.5 MG PO TB24: 37.5000 mg | ORAL_TABLET | Freq: Every day | ORAL | 1 refills | Status: DC

## 2019-12-30 NOTE — Patient Instructions (Signed)
Gout  Gout is a condition that causes painful swelling of the joints. Gout is a type of inflammation of the joints (arthritis). This condition is caused by having too much uric acid in the body. Uric acid is a chemical that forms when the body breaks down substances called purines. Purines are important for building body proteins. When the body has too much uric acid, sharp crystals can form and build up inside the joints. This causes pain and swelling. Gout attacks can happen quickly and may be very painful (acute gout). Over time, the attacks can affect more joints and become more frequent (chronic gout). Gout can also cause uric acid to build up under the skin and inside the kidneys. What are the causes? This condition is caused by too much uric acid in your blood. This can happen because:  Your kidneys do not remove enough uric acid from your blood. This is the most common cause.  Your body makes too much uric acid. This can happen with some cancers and cancer treatments. It can also occur if your body is breaking down too many red blood cells (hemolytic anemia).  You eat too many foods that are high in purines. These foods include organ meats and some seafood. Alcohol, especially beer, is also high in purines. A gout attack may be triggered by trauma or stress. What increases the risk? You are more likely to develop this condition if you:  Have a family history of gout.  Are male and middle-aged.  Are male and have gone through menopause.  Are obese.  Frequently drink alcohol, especially beer.  Are dehydrated.  Lose weight too quickly.  Have an organ transplant.  Have lead poisoning.  Take certain medicines, including aspirin, cyclosporine, diuretics, levodopa, and niacin.  Have kidney disease.  Have a skin condition called psoriasis. What are the signs or symptoms? An attack of acute gout happens quickly. It usually occurs in just one joint. The most common place is  the big toe. Attacks often start at night. Other joints that may be affected include joints of the feet, ankle, knee, fingers, wrist, or elbow. Symptoms of this condition may include:  Severe pain.  Warmth.  Swelling.  Stiffness.  Tenderness. The affected joint may be very painful to touch.  Shiny, red, or purple skin.  Chills and fever. Chronic gout may cause symptoms more frequently. More joints may be involved. You may also have white or yellow lumps (tophi) on your hands or feet or in other areas near your joints. How is this diagnosed? This condition is diagnosed based on your symptoms, medical history, and physical exam. You may have tests, such as:  Blood tests to measure uric acid levels.  Removal of joint fluid with a thin needle (aspiration) to look for uric acid crystals.  X-rays to look for joint damage. How is this treated? Treatment for this condition has two phases: treating an acute attack and preventing future attacks. Acute gout treatment may include medicines to reduce pain and swelling, including:  NSAIDs.  Steroids. These are strong anti-inflammatory medicines that can be taken by mouth (orally) or injected into a joint.  Colchicine. This medicine relieves pain and swelling when it is taken soon after an attack. It can be given by mouth or through an IV. Preventive treatment may include:  Daily use of smaller doses of NSAIDs or colchicine.  Use of a medicine that reduces uric acid levels in your blood.  Changes to your diet. You may   need to see a dietitian about what to eat and drink to prevent gout. Follow these instructions at home: During a gout attack   If directed, put ice on the affected area: ? Put ice in a plastic bag. ? Place a towel between your skin and the bag. ? Leave the ice on for 20 minutes, 2-3 times a day.  Raise (elevate) the affected joint above the level of your heart as often as possible.  Rest the joint as much as possible.  If the affected joint is in your leg, you may be given crutches to use.  Follow instructions from your health care provider about eating or drinking restrictions. Avoiding future gout attacks  Follow a low-purine diet as told by your dietitian or health care provider. Avoid foods and drinks that are high in purines, including liver, kidney, anchovies, asparagus, herring, mushrooms, mussels, and beer.  Maintain a healthy weight or lose weight if you are overweight. If you want to lose weight, talk with your health care provider. It is important that you do not lose weight too quickly.  Start or maintain an exercise program as told by your health care provider. Eating and drinking  Drink enough fluids to keep your urine pale yellow.  If you drink alcohol: ? Limit how much you use to:  0-1 drink a day for women.  0-2 drinks a day for men. ? Be aware of how much alcohol is in your drink. In the [place] one drink equals one 12 oz bottle of beer (355 mL) one 5 oz glass of wine (148 mL), or one 1 oz glass of hard liquor (44 mL). General instructions  Take over-the-counter and prescription medicines only as told by your health care provider.  Do not drive or use heavy machinery while taking prescription pain medicine.  Return to your normal activities as told by your health care provider. Ask your health care provider what activities are safe for you.  Keep all follow-up visits as told by your health care provider. This is important. Contact a health care provider if you have:  Another gout attack.  Continuing symptoms of a gout attack after 10 days of treatment.  Side effects from your medicines.  Chills or a fever.  Burning pain when you urinate.  Pain in your lower back or belly. Get help right away if you:  Have severe or uncontrolled pain.  Cannot urinate. Summary  Gout is painful swelling of the joints caused by inflammation.  The most common site of pain is the big  toe, but it can affect other joints in the body.  Medicines and dietary changes can help to prevent and treat gout attacks. This information is not intended to replace advice given to you by your health care provider. Make sure you discuss any questions you have with your health care provider. Document Revised: 05/12/2018 Document Reviewed: 05/12/2018 Elsevier Patient Education  2020 [employer]  

## 2019-12-30 NOTE — Progress Notes (Signed)
Established Patient Office Visit  Subjective:  Patient ID: John Singh, male    DOB: 1971-09-25  Age: 49 y.o. MRN: [id number]  CC:  Chief Complaint  Patient presents with  . Follow-up    follow up on medications, no concerns.    HPI John Singh presents for follow-up of his depression status post restarting the Paxil CR.  Things are better with it but not as good as they had previously been with Paxil.  His wife is commented on irritability.  He had discontinued alcohol for 10 months but then had a slip.  He is currently not drinking.  He is smoking marijuana couple times a week.  Hyzaar seems to be helping his blood pressure.  He thinks he may have been experiencing ED after starting it.  Has had a gouty attack in the right knee for the last few days.  Seems to be resolving some what.  Past Medical History:  Diagnosis Date  . Degenerative joint disease (DJD) of hip   . Gout   . Hypertension     Past Surgical History:  Procedure Laterality Date  . JOINT REPLACEMENT    . TOTAL HIP ARTHROPLASTY      Family History  Problem Relation Age of Onset  . Heart disease Mother   . Alcohol abuse Father   . Alcohol abuse Brother   . Drug abuse Brother     Social History   Socioeconomic History  . Marital status: Single    Spouse name: Not on file  . Number of children: Not on file  . Years of education: Not on file  . Highest education level: Not on file  Occupational History  . Not on file  Tobacco Use  . Smoking status: Never Smoker  . Smokeless tobacco: Never Used  Substance and Sexual Activity  . Alcohol use: Yes    Comment: occassional  . Drug use: Yes    Types: Marijuana  . Sexual activity: Not on file  Other Topics Concern  . Not on file  Social History Narrative  . Not on file   Social Determinants of Health   Financial Resource Strain:   . Difficulty of Paying Living Expenses: Not on file  Food Insecurity:   . Worried About Programme researcher, broadcasting/film/video in the  Last Year: Not on file  . Ran Out of Food in the Last Year: Not on file  Transportation Needs:   . Lack of Transportation (Medical): Not on file  . Lack of Transportation (Non-Medical): Not on file  Physical Activity:   . Days of Exercise per Week: Not on file  . Minutes of Exercise per Session: Not on file  Stress:   . Feeling of Stress : Not on file  Social Connections:   . Frequency of Communication with Friends and Family: Not on file  . Frequency of Social Gatherings with Friends and Family: Not on file  . Attends Religious Services: Not on file  . Active Member of Clubs or Organizations: Not on file  . Attends Banker Meetings: Not on file  . Marital Status: Not on file  Intimate Partner Violence:   . Fear of Current or Ex-Partner: Not on file  . Emotionally Abused: Not on file  . Physically Abused: Not on file  . Sexually Abused: Not on file    Outpatient Medications Prior to Visit  Medication Sig Dispense Refill  . losartan-hydrochlorothiazide (HYZAAR) 100-12.5 MG tablet Take 1 tablet by mouth daily. 90  tablet 0  . colchicine 0.6 MG tablet Take 1 tablet (0.6 mg total) by mouth daily. 30 tablet 0  . PARoxetine (PAXIL-CR) 25 MG 24 hr tablet TAKE 1 TABLET(25 MG) BY MOUTH DAILY 90 tablet 0   No facility-administered medications prior to visit.    No Known Allergies  ROS Review of Systems  Constitutional: Negative.   HENT: Negative.   Respiratory: Negative.   Cardiovascular: Negative.   Gastrointestinal: Negative.   Genitourinary: Negative.   Musculoskeletal: Positive for arthralgias and joint swelling.  Skin: Negative for pallor and rash.  Neurological: Negative for light-headedness and numbness.  Psychiatric/Behavioral: Positive for dysphoric mood.       Depression screen [facility] 2/9 12/30/2019 12/30/2019 11/11/2019  Decreased Interest 1 1 0  Down, Depressed, Hopeless 1 1 0  PHQ - 2 Score 2 2 0  Altered sleeping 2 2 -  Tired, decreased energy 2 2 -   Change in appetite 1 1 -  Feeling bad or failure about yourself  1 1 -  Trouble concentrating 1 1 -  Moving slowly or fidgety/restless - - -  Suicidal thoughts 1 1 -  PHQ-9 Score 10 10 -  Difficult doing work/chores Not difficult at all Not difficult at all -    Objective:    Physical Exam  Constitutional: He appears well-developed and well-nourished. No distress.  HENT:  Head: Normocephalic and atraumatic.  Right Ear: External ear normal.  Left Ear: External ear normal.  Eyes: Conjunctivae are normal. Right eye exhibits no discharge. Left eye exhibits no discharge. No scleral icterus.  Neck: No JVD present. No tracheal deviation present.  Cardiovascular: Normal rate, regular rhythm and normal heart sounds.  Pulmonary/Chest: Breath sounds normal. No stridor.  Abdominal: Bowel sounds are normal.  Musculoskeletal:     Right knee: Swelling, effusion and erythema present. Decreased range of motion.  Neurological: He is alert.  Skin: Skin is warm and dry. He is not diaphoretic.  Psychiatric: He has a normal mood and affect. His behavior is normal.    BP 112/78   Pulse 73   Temp 98.1 F (36.7 C) (Tympanic)   Ht 6\' 1"  (1.854 m)   Wt 210 lb 6.4 oz (95.4 kg)   SpO2 97%   BMI 27.76 kg/m  Wt Readings from Last 3 Encounters:  12/30/19 210 lb 6.4 oz (95.4 kg)  11/11/19 213 lb (96.6 kg)  09/14/18 216 lb (98 kg)     Health Maintenance Due  Topic Date Due  . HIV Screening  10/28/1986  . TETANUS/TDAP  10/28/1990  . INFLUENZA VACCINE  06/04/2019    There are no preventive care reminders to display for this patient.  No results found for: TSH Lab Results  Component Value Date   WBC 7.0 05/14/2018   HGB 16.5 05/14/2018   HCT 47.3 05/14/2018   MCV 94.2 05/14/2018   PLT 289.0 05/14/2018   Lab Results  Component Value Date   NA 140 09/14/2018   K 4.6 09/14/2018   CO2 29 09/14/2018   GLUCOSE 100 (H) 09/14/2018   BUN 18 09/14/2018   CREATININE 1.16 09/14/2018   BILITOT  0.5 09/14/2018   ALKPHOS 101 09/14/2018   AST 20 09/14/2018   ALT 26 09/14/2018   PROT 7.5 09/14/2018   ALBUMIN 4.4 09/14/2018   CALCIUM 10.0 09/14/2018   ANIONGAP 9 02/17/2017   GFR 71.77 09/14/2018   Lab Results  Component Value Date   CHOL 233 (H) 05/14/2018   Lab Results  Component Value Date   HDL 47.90 05/14/2018   Lab Results  Component Value Date   LDLCALC 153 (H) 05/14/2018   Lab Results  Component Value Date   TRIG 157.0 (H) 05/14/2018   Lab Results  Component Value Date   CHOLHDL 5 05/14/2018   No results found for: HGBA1C    Assessment & Plan:   Problem List Items Addressed This Visit      Cardiovascular and Mediastinum   Essential hypertension - Primary   Relevant Medications   losartan (COZAAR) 100 MG tablet   Other Relevant Orders   CBC   Comprehensive metabolic panel     Musculoskeletal and Integument   Acute gout of right knee   Relevant Medications   colchicine 0.6 MG tablet   indomethacin (INDOCIN) 50 MG capsule   Other Relevant Orders   Uric acid     Other   Depression, recurrent (HCC)   Relevant Medications   PARoxetine (PAXIL CR) 37.5 MG 24 hr tablet   Elevated LDL cholesterol level   Relevant Orders   LDL cholesterol, direct   Medication side effect   Relevant Medications   losartan (COZAAR) 100 MG tablet      Meds ordered this encounter  Medications  . PARoxetine (PAXIL CR) 37.5 MG 24 hr tablet    Sig: Take 1 tablet (37.5 mg total) by mouth daily.    Dispense:  30 tablet    Refill:  1  . colchicine 0.6 MG tablet    Sig: Take 1 tablet (0.6 mg total) by mouth daily.    Dispense:  30 tablet    Refill:  0  . indomethacin (INDOCIN) 50 MG capsule    Sig: Take 1 capsule (50 mg total) by mouth 3 (three) times daily as needed.    Dispense:  30 capsule    Refill:  0  . losartan (COZAAR) 100 MG tablet    Sig: Take 1 tablet (100 mg total) by mouth daily.    Dispense:  90 tablet    Refill:  3    Follow-up: Return in  about 1 month (around 01/27/2020).  Increase Paxil to 37.5 mg.  Have written for losartan 100 mg without days HCTZ to see if that will help the day.  Refilled colchicine and Indocin.  Will use the Indocin only as needed for pain use the colchicine for the duration of the attack.  We discussed using allopurinol to prevent future attacks.  Follow-up will be in 1 month.  His marijuana use holding him back. ETOH?  Mliss Sax, MD

## 2019-12-31 LAB — COMPREHENSIVE METABOLIC PANEL WITH GFR
AG Ratio: 1.6 (calc) (ref 1.0–2.5)
ALT: 24 U/L (ref 9–46)
AST: 19 U/L (ref 10–40)
Albumin: 4.1 g/dL (ref 3.6–5.1)
Alkaline phosphatase (APISO): 99 U/L (ref 36–130)
BUN: 13 mg/dL (ref 7–25)
CO2: 28 mmol/L (ref 20–32)
Calcium: 9.5 mg/dL (ref 8.6–10.3)
Chloride: 102 mmol/L (ref 98–110)
Creat: 1.09 mg/dL (ref 0.60–1.35)
Globulin: 2.5 g/dL (ref 1.9–3.7)
Glucose, Bld: 97 mg/dL (ref 65–99)
Potassium: 3.6 mmol/L (ref 3.5–5.3)
Sodium: 139 mmol/L (ref 135–146)
Total Bilirubin: 0.7 mg/dL (ref 0.2–1.2)
Total Protein: 6.6 g/dL (ref 6.1–8.1)

## 2019-12-31 LAB — CBC
HCT: 44.7 % (ref 38.5–50.0)
Hemoglobin: 15.5 g/dL (ref 13.2–17.1)
MCH: 32.4 pg (ref 27.0–33.0)
MCHC: 34.7 g/dL (ref 32.0–36.0)
MCV: 93.3 fL (ref 80.0–100.0)
MPV: 10.7 fL (ref 7.5–12.5)
Platelets: 264 Thousand/uL (ref 140–400)
RBC: 4.79 Million/uL (ref 4.20–5.80)
RDW: 12.8 % (ref 11.0–15.0)
WBC: 7.2 Thousand/uL (ref 3.8–10.8)

## 2019-12-31 LAB — LDL CHOLESTEROL, DIRECT: Direct LDL: 137 mg/dL — ABNORMAL HIGH (ref <100)

## 2019-12-31 LAB — URIC ACID: Uric Acid, Serum: 7.5 mg/dL (ref 4.0–8.0)

## 2020-01-24 ENCOUNTER — Other Ambulatory Visit: Payer: Self-pay

## 2020-01-25 ENCOUNTER — Ambulatory Visit (INDEPENDENT_AMBULATORY_CARE_PROVIDER_SITE_OTHER): Payer: 59 | Admitting: Family Medicine

## 2020-01-25 ENCOUNTER — Encounter: Payer: Self-pay | Admitting: Family Medicine

## 2020-01-25 VITALS — BP 104/74 | HR 85 | Temp 97.5°F | Ht 73.0 in

## 2020-01-25 DIAGNOSIS — I10 Essential (primary) hypertension: Secondary | ICD-10-CM | POA: Diagnosis not present

## 2020-01-25 DIAGNOSIS — T887XXA Unspecified adverse effect of drug or medicament, initial encounter: Secondary | ICD-10-CM | POA: Diagnosis not present

## 2020-01-25 DIAGNOSIS — F339 Major depressive disorder, recurrent, unspecified: Secondary | ICD-10-CM | POA: Diagnosis not present

## 2020-01-25 MED ORDER — LOSARTAN POTASSIUM 100 MG PO TABS: 100.0000 mg | ORAL_TABLET | Freq: Every day | ORAL | 3 refills | Status: AC

## 2020-01-25 MED ORDER — PAROXETINE HCL ER 37.5 MG PO TB24: 37.5000 mg | ORAL_TABLET | Freq: Every day | ORAL | 0 refills | Status: DC

## 2020-01-25 NOTE — Progress Notes (Signed)
Acute Office Visit  Subjective:    Patient ID: John Singh, male    DOB: 01-02-1971, 49 y.o.   MRN: [id number]  Chief Complaint  Patient presents with  . Follow-up    1 month follow up on BP and depressiion, no concerns.     HPI Patient is in today for follow-up of his blood pressure and depression.  Blood pressure is well controlled switching to the higher dose of losartan status post can discontinuation of Hyzaar secondary to gout flare.  Seems to have started to respond to the Paxil.  Recently started at 37.5 mg pills.  Things seem to be better at home and work.  Has markedly decreased his alcohol intake and feels much better.  Past Medical History:  Diagnosis Date  . Degenerative joint disease (DJD) of hip   . Gout   . Hypertension     Past Surgical History:  Procedure Laterality Date  . JOINT REPLACEMENT    . TOTAL HIP ARTHROPLASTY      Family History  Problem Relation Age of Onset  . Heart disease Mother   . Alcohol abuse Father   . Alcohol abuse Brother   . Drug abuse Brother     Social History   Socioeconomic History  . Marital status: Single    Spouse name: Not on file  . Number of children: Not on file  . Years of education: Not on file  . Highest education level: Not on file  Occupational History  . Not on file  Tobacco Use  . Smoking status: Never Smoker  . Smokeless tobacco: Never Used  Substance and Sexual Activity  . Alcohol use: Yes    Comment: occassional  . Drug use: Yes    Types: Marijuana  . Sexual activity: Not on file  Other Topics Concern  . Not on file  Social History Narrative  . Not on file   Social Determinants of Health   Financial Resource Strain:   . Difficulty of Paying Living Expenses:   Food Insecurity:   . Worried About Programme researcher, broadcasting/film/video in the Last Year:   . Barista in the Last Year:   Transportation Needs:   . Freight forwarder (Medical):   Marland Kitchen Lack of Transportation (Non-Medical):   Physical  Activity:   . Days of Exercise per Week:   . Minutes of Exercise per Session:   Stress:   . Feeling of Stress :   Social Connections:   . Frequency of Communication with Friends and Family:   . Frequency of Social Gatherings with Friends and Family:   . Attends Religious Services:   . Active Member of Clubs or Organizations:   . Attends Banker Meetings:   Marland Kitchen Marital Status:   Intimate Partner Violence:   . Fear of Current or Ex-Partner:   . Emotionally Abused:   Marland Kitchen Physically Abused:   . Sexually Abused:     Outpatient Medications Prior to Visit  Medication Sig Dispense Refill  . colchicine 0.6 MG tablet Take 1 tablet (0.6 mg total) by mouth daily. 30 tablet 0  . losartan (COZAAR) 100 MG tablet Take 1 tablet (100 mg total) by mouth daily. 90 tablet 3  . PARoxetine (PAXIL CR) 37.5 MG 24 hr tablet Take 1 tablet (37.5 mg total) by mouth daily. 30 tablet 1  . indomethacin (INDOCIN) 50 MG capsule Take 1 capsule (50 mg total) by mouth 3 (three) times daily as needed. (Patient not  taking: Reported on 01/25/2020) 30 capsule 0  . losartan-hydrochlorothiazide (HYZAAR) 100-12.5 MG tablet Take 1 tablet by mouth daily. (Patient not taking: Reported on 01/25/2020) 90 tablet 0   No facility-administered medications prior to visit.    No Known Allergies  Review of Systems  Constitutional: Negative.   HENT: Negative.   Respiratory: Negative.   Cardiovascular: Negative.   Genitourinary: Negative.        Depression screen [facility] 2/9 01/25/2020 12/30/2019 12/30/2019  Decreased Interest 1 1 1   Down, Depressed, Hopeless 0 1 1  PHQ - 2 Score 1 2 2   Altered sleeping 1 2 2   Tired, decreased energy 1 2 2   Change in appetite 0 1 1  Feeling bad or failure about yourself  0 1 1  Trouble concentrating 1 1 1   Moving slowly or fidgety/restless 0 - -  Suicidal thoughts 0 1 1  PHQ-9 Score 4 10 10   Difficult doing work/chores - Not difficult at all Not difficult at all    Objective:     Physical Exam Constitutional:      General: He is not in acute distress.    Appearance: Normal appearance. He is not ill-appearing, toxic-appearing or diaphoretic.  HENT:     Head: Normocephalic and atraumatic.     Right Ear: External ear normal.     Left Ear: External ear normal.  Eyes:     General:        Right eye: No discharge.        Left eye: No discharge.  Cardiovascular:     Rate and Rhythm: Normal rate and regular rhythm.  Pulmonary:     Effort: Pulmonary effort is normal.     Breath sounds: Normal breath sounds.  Skin:    General: Skin is warm and dry.  Neurological:     Mental Status: He is alert and oriented to person, place, and time.  Psychiatric:        Mood and Affect: Mood normal.        Behavior: Behavior normal.     BP 104/74   Pulse 85   Temp (!) 97.5 F (36.4 C) (Tympanic)   Ht 6\' 1"  (1.854 m)   SpO2 97%   BMI 27.76 kg/m  Wt Readings from Last 3 Encounters:  12/30/19 210 lb 6.4 oz (95.4 kg)  11/11/19 213 lb (96.6 kg)  09/14/18 216 lb (98 kg)    Health Maintenance Due  Topic Date Due  . HIV Screening  Never done  . TETANUS/TDAP  Never done  . INFLUENZA VACCINE  Never done    There are no preventive care reminders to display for this patient.   No results found for: TSH Lab Results  Component Value Date   WBC 7.2 12/30/2019   HGB 15.5 12/30/2019   HCT 44.7 12/30/2019   MCV 93.3 12/30/2019   PLT 264 12/30/2019   Lab Results  Component Value Date   NA 139 12/30/2019   K 3.6 12/30/2019   CO2 28 12/30/2019   GLUCOSE 97 12/30/2019   BUN 13 12/30/2019   CREATININE 1.09 12/30/2019   BILITOT 0.7 12/30/2019   ALKPHOS 101 09/14/2018   AST 19 12/30/2019   ALT 24 12/30/2019   PROT 6.6 12/30/2019   ALBUMIN 4.4 09/14/2018   CALCIUM 9.5 12/30/2019   ANIONGAP 9 02/17/2017   GFR 71.77 09/14/2018   Lab Results  Component Value Date   CHOL 233 (H) 05/14/2018   Lab Results  Component Value Date  HDL 47.90 05/14/2018   Lab Results   Component Value Date   LDLCALC 153 (H) 05/14/2018   Lab Results  Component Value Date   TRIG 157.0 (H) 05/14/2018   Lab Results  Component Value Date   CHOLHDL 5 05/14/2018   No results found for: HGBA1C     Assessment & Plan:   Problem List Items Addressed This Visit      Cardiovascular and Mediastinum   Essential hypertension - Primary   Relevant Medications   losartan (COZAAR) 100 MG tablet     Other   Depression, recurrent (HCC)   Relevant Medications   PARoxetine (PAXIL CR) 37.5 MG 24 hr tablet   Medication side effect       Meds ordered this encounter  Medications  . losartan (COZAAR) 100 MG tablet    Sig: Take 1 tablet (100 mg total) by mouth daily.    Dispense:  90 tablet    Refill:  3  . PARoxetine (PAXIL CR) 37.5 MG 24 hr tablet    Sig: Take 1 tablet (37.5 mg total) by mouth daily.    Dispense:  100 tablet    Refill:  0     Libby Maw, MD

## 2020-01-26 ENCOUNTER — Ambulatory Visit: Payer: 59 | Admitting: Family Medicine

## 2020-04-20 ENCOUNTER — Other Ambulatory Visit: Payer: Self-pay | Admitting: Family Medicine

## 2020-04-20 DIAGNOSIS — F339 Major depressive disorder, recurrent, unspecified: Secondary | ICD-10-CM

## 2020-04-20 DIAGNOSIS — I10 Essential (primary) hypertension: Secondary | ICD-10-CM

## 2020-07-11 ENCOUNTER — Ambulatory Visit: Payer: 59 | Admitting: Nurse Practitioner

## 2020-07-11 ENCOUNTER — Ambulatory Visit (INDEPENDENT_AMBULATORY_CARE_PROVIDER_SITE_OTHER): Payer: 59

## 2020-07-11 ENCOUNTER — Encounter: Payer: Self-pay | Admitting: Nurse Practitioner

## 2020-07-11 ENCOUNTER — Other Ambulatory Visit: Payer: Self-pay

## 2020-07-11 ENCOUNTER — Telehealth: Payer: Self-pay | Admitting: Family Medicine

## 2020-07-11 VITALS — BP 150/110 | HR 80 | Temp 97.0°F | Ht 73.0 in | Wt 206.2 lb

## 2020-07-11 DIAGNOSIS — L03031 Cellulitis of right toe: Secondary | ICD-10-CM

## 2020-07-11 DIAGNOSIS — S91104A Unspecified open wound of right lesser toe(s) without damage to nail, initial encounter: Secondary | ICD-10-CM

## 2020-07-11 MED ORDER — HYDROGEL GEL: 1.0000 [in_us] | Freq: Every day | 0 refills | Status: DC

## 2020-07-11 MED ORDER — CLINDAMYCIN HCL 300 MG PO CAPS: 300.0000 mg | ORAL_CAPSULE | Freq: Three times a day (TID) | ORAL | 0 refills | Status: DC

## 2020-07-11 NOTE — Patient Instructions (Signed)
Go to lab for toe x-ray. Contact podiatry for f/up appt in 1week Clean wound daily with water and soap, apply dry dressing with hydrogel.

## 2020-07-11 NOTE — Telephone Encounter (Signed)
At checkout, patient states he does not need the new referral to podiatry. He states he already has a foot doctor. Please cancel referral that was placed today.

## 2020-07-11 NOTE — Progress Notes (Signed)
Subjective:  Patient ID: John Singh, male    DOB: Apr 04, 1971  Age: 49 y.o. MRN: [id number]  CC: Acute Visit (Pt c/o right toe pain that has now caused him to lose feeling in his right foot x4-5 days. )  Toe Pain  The incident occurred at home. The injury mechanism was a direct blow. The pain is present in the right foot. The quality of the pain is described as aching and burning. The pain has been constant since onset. Associated symptoms include a loss of sensation. Pertinent negatives include no loss of motion, numbness or tingling. The symptoms are aggravated by weight bearing and movement. He has tried nothing for the symptoms.  chronic LE neuropathy due to ETOH abuse: a fifth of vodka a day. No Hx of DM Hx of 2nd R. Toe amputated.  Reviewed past Medical, Social and Family history today.  Outpatient Medications Prior to Visit  Medication Sig Dispense Refill  . colchicine 0.6 MG tablet Take 1 tablet (0.6 mg total) by mouth daily. 30 tablet 0  . losartan (COZAAR) 100 MG tablet Take 1 tablet (100 mg total) by mouth daily. 90 tablet 3  . PARoxetine (PAXIL CR) 37.5 MG 24 hr tablet Take 1 tablet (37.5 mg total) by mouth daily. 100 tablet 0   No facility-administered medications prior to visit.    ROS See HPI  Objective:  BP (!) 150/110 (BP Location: Left Arm, Patient Position: Sitting, Cuff Size: Normal)   Pulse 80   Temp (!) 97 F (36.1 C) (Temporal)   Ht 6\' 1"  (1.854 m)   Wt 206 lb 3.2 oz (93.5 kg)   SpO2 98%   BMI 27.20 kg/m   Physical Exam Pulmonary:     Effort: Pulmonary effort is normal.  Musculoskeletal:     Right foot: Deformity and prominent metatarsal heads present.     Left foot: Deformity and prominent metatarsal heads present.       Feet:  Feet:     Right foot:     Toenail Condition: Right toenails are abnormally thick.     Left foot:     Toenail Condition: Left toenails are abnormally thick.  Neurological:     Mental Status: He is alert and oriented  to person, place, and time.    Assessment & Plan:  This visit occurred during the SARS-CoV-2 public health emergency.  Safety protocols were in place, including screening questions prior to the visit, additional usage of staff PPE, and extensive cleaning of exam room while observing appropriate contact time as indicated for disinfecting solutions.   John Singh was seen today for acute visit.  Diagnoses and all orders for this visit:  Cellulitis of toe, right -     DG Toe 3rd Right -     Ambulatory referral to Podiatry -     clindamycin (CLEOCIN) 300 MG capsule; Take 1 capsule (300 mg total) by mouth 3 (three) times daily. With food  Open wound of third toe of right foot, initial encounter -     Carbomer Gel Base (HYDROGEL) GEL; 1 inch by Does not apply route daily.    Problem List Items Addressed This Visit    None    Visit Diagnoses    Cellulitis of toe, right    -  Primary   Relevant Medications   clindamycin (CLEOCIN) 300 MG capsule   Other Relevant Orders   DG Toe 3rd Right (Completed)   Ambulatory referral to Podiatry   Open wound of third  toe of right foot, initial encounter       Relevant Medications   Carbomer Gel Base (HYDROGEL) GEL      Follow-up: No follow-ups on file.  Alysia Penna, NP

## 2020-07-16 ENCOUNTER — Encounter: Payer: Self-pay | Admitting: Nurse Practitioner

## 2020-07-18 ENCOUNTER — Other Ambulatory Visit: Payer: Self-pay

## 2020-07-18 ENCOUNTER — Ambulatory Visit: Payer: 59 | Admitting: Podiatry

## 2020-07-18 DIAGNOSIS — G621 Alcoholic polyneuropathy: Secondary | ICD-10-CM

## 2020-07-18 DIAGNOSIS — L97511 Non-pressure chronic ulcer of other part of right foot limited to breakdown of skin: Secondary | ICD-10-CM

## 2020-07-18 MED ORDER — DOXYCYCLINE HYCLATE 100 MG PO TABS: 100.0000 mg | ORAL_TABLET | Freq: Two times a day (BID) | ORAL | 0 refills | Status: DC

## 2020-07-19 ENCOUNTER — Encounter: Payer: Self-pay | Admitting: Podiatry

## 2020-07-19 NOTE — Progress Notes (Signed)
Subjective:  Patient ID: John Singh, male    DOB: 09-02-1971,  MRN: [id number]  Chief Complaint  Patient presents with  . Toe Pain    Right foot 2nd toe ulcer like sore on top of toe    49 y.o. male presents for wound care.  Patient presents with right digit distal tip ulceration with mild erythema.  Patient states that he is not a diabetic however he does have neuropathy from alcohol.  Patient states that he tends to drink a pint of liquor a day.  Patient states he has got severe neuropathy in his foot which could have led to this ulceration.  He does not recall how the ulceration happened.  He denies any other acute complaints he denies seeing anyone else prior to seeing me.  He would like to discuss treatment options.   Review of Systems: Negative except as noted in the HPI. Denies N/V/F/Ch.  Past Medical History:  Diagnosis Date  . Degenerative joint disease (DJD) of hip   . Gout   . Hypertension     Current Outpatient Medications:  .  Carbomer Gel Base (HYDROGEL) GEL, 1 inch by Does not apply route daily., Disp: 100 g, Rfl: 0 .  clindamycin (CLEOCIN) 300 MG capsule, Take 1 capsule (300 mg total) by mouth 3 (three) times daily. With food, Disp: 30 capsule, Rfl: 0 .  colchicine 0.6 MG tablet, Take 1 tablet (0.6 mg total) by mouth daily., Disp: 30 tablet, Rfl: 0 .  doxycycline (VIBRA-TABS) 100 MG tablet, Take 1 tablet (100 mg total) by mouth 2 (two) times daily., Disp: 20 tablet, Rfl: 0 .  losartan (COZAAR) 100 MG tablet, Take 1 tablet (100 mg total) by mouth daily., Disp: 90 tablet, Rfl: 3 .  PARoxetine (PAXIL CR) 37.5 MG 24 hr tablet, Take 1 tablet (37.5 mg total) by mouth daily., Disp: 100 tablet, Rfl: 0  Social History   Tobacco Use  Smoking Status Never Smoker  Smokeless Tobacco Never Used    No Known Allergies Objective:  There were no vitals filed for this visit. There is no height or weight on file to calculate BMI. Constitutional Well developed. Well nourished.   Vascular Dorsalis pedis pulses palpable bilaterally. Posterior tibial pulses palpable bilaterally. Capillary refill normal to all digits.  No cyanosis or clubbing noted. Pedal hair growth normal.  Neurologic Normal speech. Oriented to person, place, and time. Protective sensation absent  Dermatologic Wound Location: Right distal third digit ulceration limited to the breakdown of the skin.  Does not probe to bone.  Beside erythema no other clinical signs of infection present no malodor present Wound Base: Mixed Granular/Fibrotic Peri-wound: Normal Exudate: Scant/small amount Serous exudate Wound Measurements: -See below  Orthopedic: No pain to palpation either foot.   Radiographs: Previous x-rays were evaluated that were taken 2 weeks ago which showed no signs of osteomyelitis. Assessment:   1. Toe ulcer, right, limited to breakdown of skin (HCC)   2. Alcoholic peripheral neuropathy (HCC)    Plan:  Patient was evaluated and treated and all questions answered.  Ulcer right third digit ulceration limited to the breakdown of the skin -Debridement as below. -Dressed with triple antibiotic Band-Aid, DSD. -Continue off-loading with surgical shoe. -I will place him on doxycycline for skin and soft tissue prophylaxis to help with the resolve meant of erythema.  Procedure: Excisional Debridement of Wound Tool: Sharp chisel blade/tissue nipper Rationale: Removal of non-viable soft tissue from the wound to promote healing.  Anesthesia: none Pre-Debridement Wound  Measurements: 0.3 cm x 0.2 cm x 0.1 cm  Post-Debridement Wound Measurements: 0.4 cm x 0.3 cm x 0.1 cm  Type of Debridement: Sharp Excisional Tissue Removed: Non-viable soft tissue Blood loss: Minimal (<50cc) Depth of Debridement: subcutaneous tissue. Technique: Sharp excisional debridement to bleeding, viable wound base.  Wound Progress: This is my initial evaluation.  I will continue to monitor the progression of it. Site  healing conversation 7 Dressing: Dry, sterile, compression dressing. Disposition: Patient tolerated procedure well. Patient to return in 1 week for follow-up.  No follow-ups on file.

## 2020-08-03 ENCOUNTER — Encounter: Payer: Self-pay | Admitting: Podiatry

## 2020-08-03 ENCOUNTER — Ambulatory Visit (INDEPENDENT_AMBULATORY_CARE_PROVIDER_SITE_OTHER): Payer: 59 | Admitting: Podiatry

## 2020-08-03 ENCOUNTER — Other Ambulatory Visit: Payer: Self-pay

## 2020-08-03 DIAGNOSIS — L97511 Non-pressure chronic ulcer of other part of right foot limited to breakdown of skin: Secondary | ICD-10-CM | POA: Diagnosis not present

## 2020-08-03 NOTE — Progress Notes (Signed)
Subjective:  Patient ID: John Singh, male    DOB: 12-01-1970,  MRN: [id number]  Chief Complaint  Patient presents with  . Follow-up    follow up cellulitis 3rd right toe. pt states cellulitis improved.     49 y.o. male presents for wound care.  Patient presents with right distal tip third digit ulceration with mild erythema follow-up.  Patient states erythema has resolved.  However from tight bandaging there appears to be right lateral superficial ulceration likely from rubbing/abrasion.  I believe patient will benefit from our Betadine wet-to-dry dressing changes to all the digits.   Review of Systems: Negative except as noted in the HPI. Denies N/V/F/Ch.  Past Medical History:  Diagnosis Date  . Degenerative joint disease (DJD) of hip   . Gout   . Hypertension     Current Outpatient Medications:  .  Carbomer Gel Base (HYDROGEL) GEL, 1 inch by Does not apply route daily., Disp: 100 g, Rfl: 0 .  clindamycin (CLEOCIN) 300 MG capsule, Take 1 capsule (300 mg total) by mouth 3 (three) times daily. With food, Disp: 30 capsule, Rfl: 0 .  colchicine 0.6 MG tablet, Take 1 tablet (0.6 mg total) by mouth daily., Disp: 30 tablet, Rfl: 0 .  doxycycline (VIBRA-TABS) 100 MG tablet, Take 1 tablet (100 mg total) by mouth 2 (two) times daily., Disp: 20 tablet, Rfl: 0 .  losartan (COZAAR) 100 MG tablet, Take 1 tablet (100 mg total) by mouth daily., Disp: 90 tablet, Rfl: 3 .  PARoxetine (PAXIL CR) 37.5 MG 24 hr tablet, Take 1 tablet (37.5 mg total) by mouth daily., Disp: 100 tablet, Rfl: 0  Social History   Tobacco Use  Smoking Status Never Smoker  Smokeless Tobacco Never Used    No Known Allergies Objective:  There were no vitals filed for this visit. There is no height or weight on file to calculate BMI. Constitutional Well developed. Well nourished.  Vascular Dorsalis pedis pulses palpable bilaterally. Posterior tibial pulses palpable bilaterally. Capillary refill normal to all  digits.  No cyanosis or clubbing noted. Pedal hair growth normal.  Neurologic Normal speech. Oriented to person, place, and time. Protective sensation absent  Dermatologic Wound Location: Right distal third digit ulceration limited to the breakdown of the skin.  Does not probe to bone.  Beside erythema no other clinical signs of infection present no malodor present Wound Base: Mixed Granular/Fibrotic Peri-wound: Normal Exudate: Scant/small amount Serous exudate Wound Measurements: -See below  Orthopedic: No pain to palpation either foot.   Radiographs: Previous x-rays were evaluated that were taken 2 weeks ago which showed no signs of osteomyelitis. Assessment:   1. Toe ulcer, right, limited to breakdown of skin (HCC)   2. Right foot ulcer, limited to breakdown of skin (HCC)    Plan:  Patient was evaluated and treated and all questions answered.  Ulcer right third digit ulceration limited to the breakdown of the skin and now with right lateral fifth superficial wound. -Debridement as below. -Dressed with Betadine wet-to-dry dressings -Continue off-loading with surgical shoe. -Redness improved with doxycycline  Procedure: Excisional Debridement of Wound Tool: Sharp chisel blade/tissue nipper Rationale: Removal of non-viable soft tissue from the wound to promote healing.  Anesthesia: none Pre-Debridement Wound Measurements: 0.3 cm x 0.2 cm x 0.1 cm  Post-Debridement Wound Measurements: 0.4 cm x 0.3 cm x 0.1 cm  Type of Debridement: Sharp Excisional Tissue Removed: Non-viable soft tissue Blood loss: Minimal (<50cc) Depth of Debridement: subcutaneous tissue. Technique: Sharp excisional debridement to  bleeding, viable wound base.  Wound Progress: The wound is about the same.  I will continue to monitor progression of it. Site healing conversation 7 Dressing: Dry, sterile, compression dressing. Disposition: Patient tolerated procedure well. Patient to return in 1 week for  follow-up.  No follow-ups on file.

## 2020-08-07 ENCOUNTER — Telehealth: Payer: Self-pay | Admitting: Podiatry

## 2020-08-07 NOTE — Telephone Encounter (Signed)
Pt was seen last Friday and never received a refill on the doxycycline (VIBRA-TABS) 100 mg. Please advise.

## 2020-08-08 MED ORDER — DOXYCYCLINE HYCLATE 100 MG PO TABS: 100.0000 mg | ORAL_TABLET | Freq: Two times a day (BID) | ORAL | 0 refills | Status: DC

## 2020-08-08 NOTE — Telephone Encounter (Signed)
I resent it in 

## 2020-08-08 NOTE — Addendum Note (Signed)
Addended by: Nicholes Rough on: 08/08/2020 05:13 AM   Modules accepted: Orders

## 2020-08-22 ENCOUNTER — Ambulatory Visit: Payer: 59 | Admitting: Podiatry

## 2020-08-22 ENCOUNTER — Other Ambulatory Visit: Payer: Self-pay

## 2020-08-22 DIAGNOSIS — L97511 Non-pressure chronic ulcer of other part of right foot limited to breakdown of skin: Secondary | ICD-10-CM

## 2020-08-22 DIAGNOSIS — M205X1 Other deformities of toe(s) (acquired), right foot: Secondary | ICD-10-CM | POA: Diagnosis not present

## 2020-08-22 DIAGNOSIS — M2041 Other hammer toe(s) (acquired), right foot: Secondary | ICD-10-CM | POA: Diagnosis not present

## 2020-08-22 DIAGNOSIS — G621 Alcoholic polyneuropathy: Secondary | ICD-10-CM | POA: Diagnosis not present

## 2020-08-22 MED ORDER — DOXYCYCLINE HYCLATE 100 MG PO TABS: 100.0000 mg | ORAL_TABLET | Freq: Two times a day (BID) | ORAL | 0 refills | Status: DC

## 2020-08-23 ENCOUNTER — Encounter: Payer: Self-pay | Admitting: Podiatry

## 2020-08-23 NOTE — Progress Notes (Signed)
Subjective:  Patient ID: John Singh, male    DOB: May 04, 1971,  MRN: [id number]  Chief Complaint  Patient presents with  . Wound Check    PT stated he feels like its getting worse. Denies pain and has no concerns at this time     49 y.o. male presents for wound care.  Patient presents with complaint of right distal third digit ulceration with hammertoe contracture/toe contracture of the digit.  Patient states the wound looks about the same.  This has to do with because is so hammered and is putting excessive pressure on it.  He has been ambulating with his shoes on.  He also has been putting tight dressing on leading to other superficial ulcerations.  I encouraged him to not wrap it so tightly.  Patient states that he has no other concerns.  Review of Systems: Negative except as noted in the HPI. Denies N/V/F/Ch.  Past Medical History:  Diagnosis Date  . Degenerative joint disease (DJD) of hip   . Gout   . Hypertension     Current Outpatient Medications:  .  Carbomer Gel Base (HYDROGEL) GEL, 1 inch by Does not apply route daily., Disp: 100 g, Rfl: 0 .  clindamycin (CLEOCIN) 300 MG capsule, Take 1 capsule (300 mg total) by mouth 3 (three) times daily. With food, Disp: 30 capsule, Rfl: 0 .  colchicine 0.6 MG tablet, Take 1 tablet (0.6 mg total) by mouth daily., Disp: 30 tablet, Rfl: 0 .  doxycycline (VIBRA-TABS) 100 MG tablet, Take 1 tablet (100 mg total) by mouth 2 (two) times daily., Disp: 20 tablet, Rfl: 0 .  doxycycline (VIBRA-TABS) 100 MG tablet, Take 1 tablet (100 mg total) by mouth 2 (two) times daily., Disp: 20 tablet, Rfl: 0 .  doxycycline (VIBRA-TABS) 100 MG tablet, Take 1 tablet (100 mg total) by mouth 2 (two) times daily., Disp: 60 tablet, Rfl: 0 .  losartan (COZAAR) 100 MG tablet, Take 1 tablet (100 mg total) by mouth daily., Disp: 90 tablet, Rfl: 3 .  PARoxetine (PAXIL CR) 37.5 MG 24 hr tablet, Take 1 tablet (37.5 mg total) by mouth daily., Disp: 100 tablet, Rfl:  0  Social History   Tobacco Use  Smoking Status Never Smoker  Smokeless Tobacco Never Used    No Known Allergies Objective:  There were no vitals filed for this visit. There is no height or weight on file to calculate BMI. Constitutional Well developed. Well nourished.  Vascular Dorsalis pedis pulses palpable bilaterally. Posterior tibial pulses palpable bilaterally. Capillary refill normal to all digits.  No cyanosis or clubbing noted. Pedal hair growth normal.  Neurologic Normal speech. Oriented to person, place, and time. Protective sensation absent  Dermatologic Wound Location: Right distal third digit ulceration limited to the breakdown of the skin.  Does not probe to bone.  Beside erythema no other clinical signs of infection present no malodor present Wound Base: Mixed Granular/Fibrotic Peri-wound: Normal Exudate: Scant/small amount Serous exudate Wound Measurements: -See below  Semi-reducible hammertoe deformity right, 3rd toe   Pre-ulcerative callus at the tip of the right, 3rd toe  Orthopedic: No pain to palpation either foot.   Radiographs: Previous x-rays were evaluated that were taken 2 weeks ago which showed no signs of osteomyelitis. Assessment:   1. Right foot ulcer, limited to breakdown of skin (HCC)   2. Alcoholic peripheral neuropathy (HCC)   3. Toe contracture, right   4. Hammertoe of right foot    Plan:  Patient was evaluated and treated  and all questions answered.  Hammertoe right third digit with pre-ulcerative callus/ulcer -Flexor tenotomy as below. -Advised to remove the dressing in 24 hours and apply a band-aid and triple abx ointment every day thereafter.  Procedure: Flexor Tenotomy Indication for Procedure: toe with semi-reducible hammertoe with distal tip ulceration. Flexor tenotomy indicated to alleviate contracture, reduce pressure, and enhance healing of the ulceration. Location: right, 3rd toe Anesthesia: Lidocaine 1% plain; 1.5 mL  and Marcaine 0.5% plain; 1.5 mL digital block Instrumentation: 11 blade Technique: The toe was anesthetized as above and prepped in the usual fashion. The toe was exsanquinated and a tourniquet was secured at the base of the toe. An 11 blade was then used to percutaneously release the flexor tendon at the plantar surface of the toe with noted release of the hammertoe deformity.  The incision was closed with 3-0 nylon.  The incision was then dressed with antibiotic ointment and band-aid. Compression splint dressing applied. Patient tolerated the procedure well. Dressing: Dry, sterile, compression dressing. Disposition: Patient tolerated procedure well. Patient to return in 1 week for follow-up.    Ulcer right third digit ulceration limited to the breakdown of the skin and now with right lateral fifth superficial wound. -Debridement as below. -Dressed with Betadine wet-to-dry dressings -Continue off-loading with surgical shoe. -Redness improved with doxycycline  Procedure: Excisional Debridement of Wound Tool: Sharp chisel blade/tissue nipper Rationale: Removal of non-viable soft tissue from the wound to promote healing.  Anesthesia: none Pre-Debridement Wound Measurements: 0.3 cm x 0.2 cm x 0.1 cm  Post-Debridement Wound Measurements: 0.4 cm x 0.3 cm x 0.1 cm  Type of Debridement: Sharp Excisional Tissue Removed: Non-viable soft tissue Blood loss: Minimal (<50cc) Depth of Debridement: subcutaneous tissue. Technique: Sharp excisional debridement to bleeding, viable wound base.  Wound Progress: The wound is about the same.  I will continue to monitor progression of it. Site healing conversation 7 Dressing: Dry, sterile, compression dressing. Disposition: Patient tolerated procedure well. Patient to return in 1 week for follow-up.  No follow-ups on file.

## 2020-09-05 ENCOUNTER — Ambulatory Visit: Payer: 59 | Admitting: Podiatry

## 2020-09-05 ENCOUNTER — Other Ambulatory Visit: Payer: Self-pay

## 2020-09-05 DIAGNOSIS — G621 Alcoholic polyneuropathy: Secondary | ICD-10-CM

## 2020-09-05 DIAGNOSIS — L97511 Non-pressure chronic ulcer of other part of right foot limited to breakdown of skin: Secondary | ICD-10-CM | POA: Diagnosis not present

## 2020-09-06 ENCOUNTER — Encounter: Payer: Self-pay | Admitting: Podiatry

## 2020-09-06 NOTE — Progress Notes (Signed)
Subjective:  Patient ID: John Singh, male    DOB: 1970/12/07,  MRN: [id number]  Chief Complaint  Patient presents with  . Foot Ulcer    2 week  follow up cellulitis 3rd right toe    50 y.o. male presents for wound care.  Patient presents with a follow-up of right distal distal third digit ulceration and right lateral foot ulceration.  Patient states doing better.  Has been keeping Betadine on it.  He states that tenotomy site is doing well.  The stitches are intact.  He denies any other acute complaints.  Review of Systems: Negative except as noted in the HPI. Denies N/V/F/Ch.  Past Medical History:  Diagnosis Date  . Degenerative joint disease (DJD) of hip   . Gout   . Hypertension     Current Outpatient Medications:  .  Carbomer Gel Base (HYDROGEL) GEL, 1 inch by Does not apply route daily., Disp: 100 g, Rfl: 0 .  clindamycin (CLEOCIN) 300 MG capsule, Take 1 capsule (300 mg total) by mouth 3 (three) times daily. With food, Disp: 30 capsule, Rfl: 0 .  colchicine 0.6 MG tablet, Take 1 tablet (0.6 mg total) by mouth daily., Disp: 30 tablet, Rfl: 0 .  doxycycline (VIBRA-TABS) 100 MG tablet, Take 1 tablet (100 mg total) by mouth 2 (two) times daily., Disp: 20 tablet, Rfl: 0 .  doxycycline (VIBRA-TABS) 100 MG tablet, Take 1 tablet (100 mg total) by mouth 2 (two) times daily., Disp: 20 tablet, Rfl: 0 .  doxycycline (VIBRA-TABS) 100 MG tablet, Take 1 tablet (100 mg total) by mouth 2 (two) times daily., Disp: 60 tablet, Rfl: 0 .  losartan (COZAAR) 100 MG tablet, Take 1 tablet (100 mg total) by mouth daily., Disp: 90 tablet, Rfl: 3 .  PARoxetine (PAXIL CR) 37.5 MG 24 hr tablet, Take 1 tablet (37.5 mg total) by mouth daily., Disp: 100 tablet, Rfl: 0  Social History   Tobacco Use  Smoking Status Never Smoker  Smokeless Tobacco Never Used    No Known Allergies Objective:  There were no vitals filed for this visit. There is no height or weight on file to calculate BMI. Constitutional  Well developed. Well nourished.  Vascular Dorsalis pedis pulses palpable bilaterally. Posterior tibial pulses palpable bilaterally. Capillary refill normal to all digits.  No cyanosis or clubbing noted. Pedal hair growth normal.  Neurologic Normal speech. Oriented to person, place, and time. Protective sensation absent  Dermatologic Wound Location: Right distal third digit ulceration limited to the breakdown of the skin.  Does not probe to bone.  Beside erythema no other clinical signs of infection present no malodor present Wound Base: Mixed Granular/Fibrotic Peri-wound: Normal Exudate: Scant/small amount Serous exudate Wound Measurements: -See below  Semi-reducible hammertoe deformity right, 3rd toe   Pre-ulcerative callus at the tip of the right, 3rd toe  Orthopedic: No pain to palpation either foot.   Radiographs: Previous x-rays were evaluated that were taken 2 weeks ago which showed no signs of osteomyelitis. Assessment:   1. Right foot ulcer, limited to breakdown of skin (HCC)   2. Alcoholic peripheral neuropathy (HCC)    Plan:  Patient was evaluated and treated and all questions answered.  Hammertoe right third digit with pre-ulcerative callus/ulcer -Clinically healed.  He got adequate correction.    Ulcer right third digit ulceration limited to the breakdown of the skin and now with right lateral fifth superficial wound. -Debridement as below. -Dressed with Betadine wet-to-dry dressings -Continue off-loading with surgical shoe. -Redness improved  with doxycycline  Procedure: Excisional Debridement of Wound Tool: Sharp chisel blade/tissue nipper Rationale: Removal of non-viable soft tissue from the wound to promote healing.  Anesthesia: none Pre-Debridement Wound Measurements: 0.2 cm x 0.2 cm x 0.1 cm  Post-Debridement Wound Measurements: 0.3 cm x 0.3 cm x 0.1 cm  Type of Debridement: Sharp Excisional Tissue Removed: Non-viable soft tissue Blood loss: Minimal  (<50cc) Depth of Debridement: subcutaneous tissue. Technique: Sharp excisional debridement to bleeding, viable wound base.  Wound Progress: The wound seems to be regressing.  The wound measurements have decreased Site healing conversation 7 Dressing: Dry, sterile, compression dressing. Disposition: Patient tolerated procedure well. Patient to return in 1 week for follow-up.  No follow-ups on file.

## 2020-09-19 ENCOUNTER — Ambulatory Visit: Payer: 59 | Admitting: Podiatry

## 2020-09-19 ENCOUNTER — Other Ambulatory Visit: Payer: Self-pay

## 2020-09-19 DIAGNOSIS — L97511 Non-pressure chronic ulcer of other part of right foot limited to breakdown of skin: Secondary | ICD-10-CM | POA: Diagnosis not present

## 2020-09-19 DIAGNOSIS — M205X1 Other deformities of toe(s) (acquired), right foot: Secondary | ICD-10-CM

## 2020-09-19 DIAGNOSIS — L97521 Non-pressure chronic ulcer of other part of left foot limited to breakdown of skin: Secondary | ICD-10-CM

## 2020-09-19 MED ORDER — SANTYL 250 UNIT/GM EX OINT: 1.0000 "application " | TOPICAL_OINTMENT | Freq: Every day | CUTANEOUS | 0 refills | Status: DC

## 2020-09-21 ENCOUNTER — Encounter: Payer: Self-pay | Admitting: Podiatry

## 2020-09-21 NOTE — Progress Notes (Signed)
Subjective:  Patient ID: John Singh, male    DOB: [birth year],  MRN: [id number]  Chief Complaint  Patient presents with  . Follow-up    2 week follow up vellulitid 3rd right toe    49 y.o. male presents for wound care.  Patient presents with a follow-up of right third digit distal tip ulceration.  Patient states that there is too contracture in it.  Even though he failed the flexor tendon there is a possibility may have healed.  He also has a new concern for left submetatarsal 4 ulcer.  He states that this is a new site that has been bothering him.  He denies any other acute complaints.  He would like to discuss treatment options for that.  Review of Systems: Negative except as noted in the HPI. Denies N/V/F/Ch.  Past Medical History:  Diagnosis Date  . Degenerative joint disease (DJD) of hip   . Gout   . Hypertension     Current Outpatient Medications:  .  Carbomer Gel Base (HYDROGEL) GEL, 1 inch by Does not apply route daily., Disp: 100 g, Rfl: 0 .  clindamycin (CLEOCIN) 300 MG capsule, Take 1 capsule (300 mg total) by mouth 3 (three) times daily. With food, Disp: 30 capsule, Rfl: 0 .  colchicine 0.6 MG tablet, Take 1 tablet (0.6 mg total) by mouth daily., Disp: 30 tablet, Rfl: 0 .  collagenase (SANTYL) ointment, Apply 1 application topically daily., Disp: 15 g, Rfl: 0 .  doxycycline (VIBRA-TABS) 100 MG tablet, Take 1 tablet (100 mg total) by mouth 2 (two) times daily., Disp: 20 tablet, Rfl: 0 .  doxycycline (VIBRA-TABS) 100 MG tablet, Take 1 tablet (100 mg total) by mouth 2 (two) times daily., Disp: 20 tablet, Rfl: 0 .  doxycycline (VIBRA-TABS) 100 MG tablet, Take 1 tablet (100 mg total) by mouth 2 (two) times daily., Disp: 60 tablet, Rfl: 0 .  losartan (COZAAR) 100 MG tablet, Take 1 tablet (100 mg total) by mouth daily., Disp: 90 tablet, Rfl: 3 .  PARoxetine (PAXIL CR) 37.5 MG 24 hr tablet, Take 1 tablet (37.5 mg total) by mouth daily., Disp: 100 tablet, Rfl: 0  Social History     Tobacco Use  Smoking Status Never Smoker  Smokeless Tobacco Never Used    No Known Allergies Objective:  There were no vitals filed for this visit. There is no height or weight on file to calculate BMI. Constitutional Well developed. Well nourished.  Vascular Dorsalis pedis pulses palpable bilaterally. Posterior tibial pulses palpable bilaterally. Capillary refill normal to all digits.  No cyanosis or clubbing noted. Pedal hair growth normal.  Neurologic Normal speech. Oriented to person, place, and time. Protective sensation absent  Dermatologic Wound Location: Right distal third digit ulceration limited to the breakdown of the skin.  Does not probe to bone.  Beside erythema no other clinical signs of infection present no malodor present Wound Base: Mixed Granular/Fibrotic Peri-wound: Normal Exudate: Scant/small amount Serous exudate Wound Measurements: -See below  Left submetatarsal 4 ulceration limited to the breakdown of the skin.  No clinical signs of infection noted does not probe to deep tissue.  No malodor present.  No purulent drainage noted.  Semireducible recurrent hammertoe deformity of the right third digit  Distal tip ulceration  Orthopedic: No pain to palpation either foot.   Radiographs: Previous x-rays were evaluated that were taken 2 weeks ago which showed no signs of osteomyelitis. Assessment:   1. Right foot ulcer, limited to breakdown of skin (HCC)  2. Toe contracture, right   3. Foot ulcer, limited to breakdown of skin, left [facility])    Plan:  Patient was evaluated and treated and all questions answered.  Hammertoe right third digit with pre-ulcerative callus/ulcer~recurrence -Flexor tenotomy as below. -Advised to remove the dressing in 24 hours and apply a band-aid and triple abx ointment every day thereafter.  Procedure: Flexor Tenotomy Indication for Procedure: toe with semi-reducible hammertoe with distal tip ulceration. Flexor tenotomy  indicated to alleviate contracture, reduce pressure, and enhance healing of the ulceration. Location: right Anesthesia: Lidocaine 1% plain; 1.5 mL and Marcaine 0.5% plain; 1.5 mL digital block Instrumentation: 11 blade handle Technique: The toe was anesthetized as above and prepped in the usual fashion. The toe was exsanquinated and a tourniquet was secured at the base of the toe. An 11 blade handle was then used to percutaneously release the flexor tendon at the plantar surface of the toe with noted release of the hammertoe deformity. The incision was then dressed with antibiotic ointment and band-aid. Compression splint dressing applied. Patient tolerated the procedure well. Dressing: Dry, sterile, compression dressing. Disposition: Patient tolerated procedure well. Patient to return in 1 week for follow-up.   Ulcer right third digit ulceration limited to the breakdown of the skin and now with left submetatarsal 4 ulceration -Debridement as below. -Dressed with Betadine wet-to-dry dressings -Continue off-loading with surgical shoe. -Redness improved with doxycycline  Procedure: Excisional Debridement of Wound right third digit Tool: Sharp chisel blade/tissue nipper Rationale: Removal of non-viable soft tissue from the wound to promote healing.  Anesthesia: none Pre-Debridement Wound Measurements: 0.2 cm x 0.2 cm x 0.1 cm  Post-Debridement Wound Measurements: 0.3 cm x 0.3 cm x 0.1 cm  Type of Debridement: Sharp Excisional Tissue Removed: Non-viable soft tissue Blood loss: Minimal (<50cc) Depth of Debridement: subcutaneous tissue. Technique: Sharp excisional debridement to bleeding, viable wound base.  Wound Progress: The wound seems to be regressing.  The wound measurements have decreased Site healing conversation 7 Dressing: Dry, sterile, compression dressing. Disposition: Patient tolerated procedure well. Patient to return in 1 week for follow-up.   Procedure: Excisional  Debridement of Wound of left submetatarsal 4 Tool: Sharp chisel blade/tissue nipper Rationale: Removal of non-viable soft tissue from the wound to promote healing.  Anesthesia: none Pre-Debridement Wound Measurements: 0.4 cm x 0.2 cm x 0.1 cm Post-Debridement Wound Measurements: 0.3 cm x 0.3 cm x 0.1 cm  Type of Debridement: Sharp Excisional Tissue Removed: Non-viable soft tissue Blood loss: Minimal (<50cc) Depth of Debridement: subcutaneous tissue. Technique: Sharp excisional debridement to bleeding, viable wound base.  Wound Progress: The wound seems to be regressing.  The wound measurements have decreased Site healing conversation 7 Dressing: Dry, sterile, compression dressing. Disposition: Patient tolerated procedure well. Patient to return in 1 week for follow-up.  No follow-ups on file.

## 2020-10-05 ENCOUNTER — Other Ambulatory Visit: Payer: Self-pay

## 2020-10-05 ENCOUNTER — Ambulatory Visit: Payer: 59 | Admitting: Podiatry

## 2020-10-05 DIAGNOSIS — L97511 Non-pressure chronic ulcer of other part of right foot limited to breakdown of skin: Secondary | ICD-10-CM | POA: Diagnosis not present

## 2020-10-05 DIAGNOSIS — M205X1 Other deformities of toe(s) (acquired), right foot: Secondary | ICD-10-CM | POA: Diagnosis not present

## 2020-10-09 ENCOUNTER — Encounter: Payer: Self-pay | Admitting: Podiatry

## 2020-10-09 NOTE — Progress Notes (Signed)
Subjective:  Patient ID: John Singh, male    DOB: 01-14-1971,  MRN: [id number]  Chief Complaint  Patient presents with  . Follow-up    2 week  follow up cellulitis 3rd right toe    49 y.o. male presents for wound care.  Patient presents with a follow-up of right third digit distal tip ulceration.  Patient states that there is too contracture in it. He is healing well. He has been keeping the bandages clean dry and intact. His left submetatarsal four ulceration has completely reepithelialized.  Review of Systems: Negative except as noted in the HPI. Denies N/V/F/Ch.  Past Medical History:  Diagnosis Date  . Degenerative joint disease (DJD) of hip   . Gout   . Hypertension     Current Outpatient Medications:  .  Carbomer Gel Base (HYDROGEL) GEL, 1 inch by Does not apply route daily., Disp: 100 g, Rfl: 0 .  clindamycin (CLEOCIN) 300 MG capsule, Take 1 capsule (300 mg total) by mouth 3 (three) times daily. With food, Disp: 30 capsule, Rfl: 0 .  colchicine 0.6 MG tablet, Take 1 tablet (0.6 mg total) by mouth daily., Disp: 30 tablet, Rfl: 0 .  collagenase (SANTYL) ointment, Apply 1 application topically daily., Disp: 15 g, Rfl: 0 .  doxycycline (VIBRA-TABS) 100 MG tablet, Take 1 tablet (100 mg total) by mouth 2 (two) times daily., Disp: 20 tablet, Rfl: 0 .  doxycycline (VIBRA-TABS) 100 MG tablet, Take 1 tablet (100 mg total) by mouth 2 (two) times daily., Disp: 20 tablet, Rfl: 0 .  doxycycline (VIBRA-TABS) 100 MG tablet, Take 1 tablet (100 mg total) by mouth 2 (two) times daily., Disp: 60 tablet, Rfl: 0 .  losartan (COZAAR) 100 MG tablet, Take 1 tablet (100 mg total) by mouth daily., Disp: 90 tablet, Rfl: 3 .  PARoxetine (PAXIL CR) 37.5 MG 24 hr tablet, Take 1 tablet (37.5 mg total) by mouth daily., Disp: 100 tablet, Rfl: 0  Social History   Tobacco Use  Smoking Status Never Smoker  Smokeless Tobacco Never Used    No Known Allergies Objective:  There were no vitals filed for  this visit. There is no height or weight on file to calculate BMI. Constitutional Well developed. Well nourished.  Vascular Dorsalis pedis pulses palpable bilaterally. Posterior tibial pulses palpable bilaterally. Capillary refill normal to all digits.  No cyanosis or clubbing noted. Pedal hair growth normal.  Neurologic Normal speech. Oriented to person, place, and time. Protective sensation absent  Dermatologic Wound Location: Right distal third digit ulceration limited to the breakdown of the skin.  Does not probe to bone.  Beside erythema no other clinical signs of infection present no malodor present Wound Base: Mixed Granular/Fibrotic Peri-wound: Normal Exudate: Scant/small amount Serous exudate Wound Measurements: -See below  Left submetatarsal 4 ulceration clinically reepithelialized. No concern for wound noted. No concern for infection noted.  Good correction alignment and offloading noted of the hammertoe contracture right third digit  Distal tip ulceration  Orthopedic: No pain to palpation either foot.   Radiographs: Previous x-rays were evaluated that were taken 2 weeks ago which showed no signs of osteomyelitis. Assessment:   1. Right foot ulcer, limited to breakdown of skin (HCC)   2. Toe contracture, right    Plan:  Patient was evaluated and treated and all questions answered.  Hammertoe right third digit with pre-ulcerative callus/ulcer~recurrence -Clinically healed the flexor tenotomy site. No further wound noted. Improvement in good correction and reduction noted..  Left submetatarsal four ulceration -  Clinically reepithelialized. No concern for another ulceration noted. If it continues to recurs we will discuss floating osteotomies at that time.  Ulcer right third digit ulceration limited to the breakdown of the skin~stagnant -Debridement as below. -Dressed with Betadine wet-to-dry dressings -Continue off-loading with surgical shoe. -Redness improved  with doxycycline  Procedure: Excisional Debridement of Wound right third digit Tool: Sharp chisel blade/tissue nipper Rationale: Removal of non-viable soft tissue from the wound to promote healing.  Anesthesia: none Pre-Debridement Wound Measurements: 0.2 cm x 0.2 cm x 0.1 cm  Post-Debridement Wound Measurements: 0.3 cm x 0.3 cm x 0.1 cm  Type of Debridement: Sharp Excisional Tissue Removed: Non-viable soft tissue Blood loss: Minimal (<50cc) Depth of Debridement: subcutaneous tissue. Technique: Sharp excisional debridement to bleeding, viable wound base.  Wound Progress: The wound seems to be regressing.  The wound measurements have decreased Site healing conversation 7 Dressing: Dry, sterile, compression dressing. Disposition: Patient tolerated procedure well. Patient to return in 1 week for follow-up.   No follow-ups on file.

## 2020-10-24 ENCOUNTER — Ambulatory Visit: Payer: 59 | Admitting: Podiatry

## 2020-10-24 ENCOUNTER — Other Ambulatory Visit: Payer: Self-pay

## 2020-10-24 DIAGNOSIS — G621 Alcoholic polyneuropathy: Secondary | ICD-10-CM

## 2020-10-24 DIAGNOSIS — L97511 Non-pressure chronic ulcer of other part of right foot limited to breakdown of skin: Secondary | ICD-10-CM

## 2020-10-25 ENCOUNTER — Encounter: Payer: Self-pay | Admitting: Podiatry

## 2020-10-25 NOTE — Progress Notes (Signed)
Subjective:  Patient ID: John Singh, male    DOB: 1971/06/25,  MRN: [id number]  Chief Complaint  Patient presents with  . Foot Ulcer    Right foot foot ulcer follow up.  PT stated that he is doing okay he has no concerns at this time.    49 y.o. male presents for wound care.  Patient presents with a follow-up of right third digit distal tip ulceration.  Patient states that there is too contracture in it. He is healing well. He has been keeping the bandages clean dry and intact. His left submetatarsal four ulceration has completely reepithelialized.  Review of Systems: Negative except as noted in the HPI. Denies N/V/F/Ch.  Past Medical History:  Diagnosis Date  . Degenerative joint disease (DJD) of hip   . Gout   . Hypertension     Current Outpatient Medications:  .  Carbomer Gel Base (HYDROGEL) GEL, 1 inch by Does not apply route daily., Disp: 100 g, Rfl: 0 .  clindamycin (CLEOCIN) 300 MG capsule, Take 1 capsule (300 mg total) by mouth 3 (three) times daily. With food, Disp: 30 capsule, Rfl: 0 .  colchicine 0.6 MG tablet, Take 1 tablet (0.6 mg total) by mouth daily., Disp: 30 tablet, Rfl: 0 .  collagenase (SANTYL) ointment, Apply 1 application topically daily., Disp: 15 g, Rfl: 0 .  doxycycline (VIBRA-TABS) 100 MG tablet, Take 1 tablet (100 mg total) by mouth 2 (two) times daily., Disp: 20 tablet, Rfl: 0 .  doxycycline (VIBRA-TABS) 100 MG tablet, Take 1 tablet (100 mg total) by mouth 2 (two) times daily., Disp: 20 tablet, Rfl: 0 .  doxycycline (VIBRA-TABS) 100 MG tablet, Take 1 tablet (100 mg total) by mouth 2 (two) times daily., Disp: 60 tablet, Rfl: 0 .  losartan (COZAAR) 100 MG tablet, Take 1 tablet (100 mg total) by mouth daily., Disp: 90 tablet, Rfl: 3 .  PARoxetine (PAXIL CR) 37.5 MG 24 hr tablet, Take 1 tablet (37.5 mg total) by mouth daily., Disp: 100 tablet, Rfl: 0  Social History   Tobacco Use  Smoking Status Never Smoker  Smokeless Tobacco Never Used    No Known  Allergies Objective:  There were no vitals filed for this visit. There is no height or weight on file to calculate BMI. Constitutional Well developed. Well nourished.  Vascular Dorsalis pedis pulses palpable bilaterally. Posterior tibial pulses palpable bilaterally. Capillary refill normal to all digits.  No cyanosis or clubbing noted. Pedal hair growth normal.  Neurologic Normal speech. Oriented to person, place, and time. Protective sensation absent  Dermatologic Wound Location: Right distal third digit ulceration limited to the breakdown of the skin.  Does not probe to bone.  Beside erythema no other clinical signs of infection present no malodor present Wound Base: Mixed Granular/Fibrotic Peri-wound: Normal Exudate: Scant/small amount Serous exudate Wound Measurements: -See below  Left submetatarsal 4 ulceration clinically reepithelialized. No concern for wound noted. No concern for infection noted.  Good correction alignment and offloading noted of the hammertoe contracture right third digit  Distal tip ulceration  Orthopedic: No pain to palpation either foot.   Radiographs: Previous x-rays were evaluated that were taken 2 weeks ago which showed no signs of osteomyelitis. Assessment:   1. Alcoholic peripheral neuropathy (HCC)   2. Right foot ulcer, limited to breakdown of skin (HCC)    Plan:  Patient was evaluated and treated and all questions answered.  Hammertoe right third digit with pre-ulcerative callus/ulcer~recurrence -Clinically healed the flexor tenotomy site. No further  wound noted. Improvement in good correction and reduction noted..  Left submetatarsal four ulceration -Clinically reepithelialized. No concern for another ulceration noted. If it continues to recurs we will discuss floating osteotomies at that time.  Ulcer right third digit ulceration limited to the breakdown of the skin~stagnant -Debridement as below. -Dressed with Betadine wet-to-dry  dressings -Continue off-loading with surgical shoe. -Redness improved with doxycycline -At this time given that the wound is staying stagnant is likely due to the pressure of the distal tip and given that he has failed flexor tenotomy this is likely osseous deformity patient may benefit from arthroplasty of the digit.  Patient does not have diabetes however just to rule out diabetes I have plan to obtain an A1c.  Prescription for A1c was given to the patient -I discussed with the patient that he may benefit from arthroplasty of the digit to help elevate the foot without any fixation to prevent spreading of the infection.  Patient agrees with the plan will discuss it doing 3 weeks about doing an arthroplasty assuming his A1c is within normal limits  Procedure: Excisional Debridement of Wound right third digit Tool: Sharp chisel blade/tissue nipper Rationale: Removal of non-viable soft tissue from the wound to promote healing.  Anesthesia: none Pre-Debridement Wound Measurements: 0.2 cm x 0.2 cm x 0.1 cm  Post-Debridement Wound Measurements: 0.3 cm x 0.3 cm x 0.1 cm  Type of Debridement: Sharp Excisional Tissue Removed: Non-viable soft tissue Blood loss: Minimal (<50cc) Depth of Debridement: subcutaneous tissue. Technique: Sharp excisional debridement to bleeding, viable wound base.  Wound Progress: The wound seems to be regressing.  The wound measurements have decreased Site healing conversation 7 Dressing: Dry, sterile, compression dressing. Disposition: Patient tolerated procedure well. Patient to return in 1 week for follow-up.   No follow-ups on file.

## 2020-10-30 ENCOUNTER — Other Ambulatory Visit: Payer: Self-pay | Admitting: Family Medicine

## 2020-10-30 DIAGNOSIS — F339 Major depressive disorder, recurrent, unspecified: Secondary | ICD-10-CM

## 2020-10-30 NOTE — Telephone Encounter (Signed)
Please see message.  Thank you. Last OV 07/11/20 w/[facility] Last fill 01/25/20 #100/0

## 2020-10-31 MED ORDER — PAROXETINE HCL ER 37.5 MG PO TB24: ORAL_TABLET | ORAL | 0 refills | Status: DC

## 2020-10-31 NOTE — Addendum Note (Signed)
Addended by: Worthy Rancher B on: 10/31/2020 10:31 AM   Modules accepted: Orders

## 2020-11-05 ENCOUNTER — Telehealth: Payer: Self-pay

## 2020-11-05 ENCOUNTER — Other Ambulatory Visit: Payer: Self-pay

## 2020-11-05 NOTE — Telephone Encounter (Signed)
Patient calling to ask if he can get his lab work done her.  Lab order from [facility].  Ok to get blood work performed here.

## 2020-11-05 NOTE — Telephone Encounter (Signed)
Lab scheduled °

## 2020-11-06 ENCOUNTER — Other Ambulatory Visit (INDEPENDENT_AMBULATORY_CARE_PROVIDER_SITE_OTHER): Payer: 59

## 2020-11-06 DIAGNOSIS — G621 Alcoholic polyneuropathy: Secondary | ICD-10-CM

## 2020-11-06 LAB — HEMOGLOBIN A1C: Hgb A1c MFr Bld: 5.4 % (ref 4.6–6.5)

## 2020-11-14 ENCOUNTER — Ambulatory Visit: Payer: 59 | Admitting: Podiatry

## 2020-11-14 ENCOUNTER — Encounter: Payer: Self-pay | Admitting: Podiatry

## 2020-11-14 ENCOUNTER — Other Ambulatory Visit: Payer: Self-pay

## 2020-11-14 DIAGNOSIS — L989 Disorder of the skin and subcutaneous tissue, unspecified: Secondary | ICD-10-CM

## 2020-11-14 DIAGNOSIS — Z01818 Encounter for other preprocedural examination: Secondary | ICD-10-CM | POA: Diagnosis not present

## 2020-11-14 DIAGNOSIS — M2041 Other hammer toe(s) (acquired), right foot: Secondary | ICD-10-CM

## 2020-11-14 DIAGNOSIS — M24574 Contracture, right foot: Secondary | ICD-10-CM

## 2020-11-14 NOTE — Progress Notes (Signed)
Subjective:  Patient ID: John Singh, male    DOB: 01-Apr-1971,  MRN: [id number]  Chief Complaint  Patient presents with  . Foot Ulcer    PT stated that he is doing okay and he feels like things are going in the right direction.    50 y.o. male presents for wound care.  Patient presents with a follow-up of right third digit distal tip ulceration.  Patient states that there is too contracture in it. He is healing well. He has been keeping the bandages clean dry and intact.  He denies any other acute complaints  Review of Systems: Negative except as noted in the HPI. Denies N/V/F/Ch.  Past Medical History:  Diagnosis Date  . Degenerative joint disease (DJD) of hip   . Gout   . Hypertension     Current Outpatient Medications:  .  Carbomer Gel Base (HYDROGEL) GEL, 1 inch by Does not apply route daily., Disp: 100 g, Rfl: 0 .  clindamycin (CLEOCIN) 300 MG capsule, Take 1 capsule (300 mg total) by mouth 3 (three) times daily. With food, Disp: 30 capsule, Rfl: 0 .  colchicine 0.6 MG tablet, Take 1 tablet (0.6 mg total) by mouth daily., Disp: 30 tablet, Rfl: 0 .  collagenase (SANTYL) ointment, Apply 1 application topically daily., Disp: 15 g, Rfl: 0 .  doxycycline (VIBRA-TABS) 100 MG tablet, Take 1 tablet (100 mg total) by mouth 2 (two) times daily., Disp: 20 tablet, Rfl: 0 .  doxycycline (VIBRA-TABS) 100 MG tablet, Take 1 tablet (100 mg total) by mouth 2 (two) times daily., Disp: 20 tablet, Rfl: 0 .  doxycycline (VIBRA-TABS) 100 MG tablet, Take 1 tablet (100 mg total) by mouth 2 (two) times daily., Disp: 60 tablet, Rfl: 0 .  losartan (COZAAR) 100 MG tablet, Take 1 tablet (100 mg total) by mouth daily., Disp: 90 tablet, Rfl: 3 .  PARoxetine (PAXIL-CR) 37.5 MG 24 hr tablet, TAKE 1 TABLET(37.5 MG) BY MOUTH DAILY, Disp: 100 tablet, Rfl: 0  Social History   Tobacco Use  Smoking Status Never Smoker  Smokeless Tobacco Never Used    No Known Allergies Objective:  There were no vitals filed  for this visit. There is no height or weight on file to calculate BMI. Constitutional Well developed. Well nourished.  Vascular Dorsalis pedis pulses palpable bilaterally. Posterior tibial pulses palpable bilaterally. Capillary refill normal to all digits.  No cyanosis or clubbing noted. Pedal hair growth normal.  Neurologic Normal speech. Oriented to person, place, and time. Protective sensation absent  Dermatologic Wound Location: Right distal third digit ulceration limited to the breakdown of the skin.  Does not probe to bone.  Beside erythema no other clinical signs of infection present no malodor present Wound Base: Mixed Granular/Fibrotic Peri-wound: Normal Exudate: Scant/small amount Serous exudate Wound Measurements: -See below  Left submetatarsal 4 ulceration clinically reepithelialized. No concern for wound noted. No concern for infection noted.  Good correction alignment and offloading noted of the hammertoe contracture right third digit  Distal tip ulceration  Orthopedic: No pain to palpation either foot.   Radiographs: Previous x-rays were evaluated that were taken 2 weeks ago which showed no signs of osteomyelitis. Assessment:   1. Hammertoe of right foot   2. Joint contracture of foot, right   3. Preoperative examination   4. Benign skin lesion    Plan:  Patient was evaluated and treated and all questions answered.  Hammertoe right third digit with pre-ulcerative callus/ulcer~recurrence -Clinically healed the flexor tenotomy site. No further  wound noted. Improvement in good correction and reduction noted..  Left submetatarsal four ulceration -Clinically reepithelialized. No concern for another ulceration noted. If it continues to recurs we will discuss floating osteotomies at that time.  Ulcer right third digit ulceration limited to the breakdown of the skin~stagnant -Debridement as below. -Dressed with Betadine wet-to-dry dressings -Continue off-loading  with surgical shoe. -Redness improved with doxycycline -His A1c was reviewed which was within normal limits at 5.4.  He does not have diabetes I feel comfortable proceeding with surgical intervention to help correct the right third digit hammertoe with arthroplasty of the digit with possible capsulotomy of this second metatarsophalangeal joint.  The reason why believe patient will benefit from this procedure as there is excessive pressure to the distal tip of the third digit leading to ulcer formation.  Patient has a high risk of losing the digit if the ulcer continues to progress well or worsens.  At this time patient will benefit from arthroplasty without any kind of fixation to take the pressure off of distal tip of the digit.  I discussed with the patient that he is at a higher risk of losing the digit.  Patient states understanding would like to proceed with the surgery to correct the hammertoe as well as address the joint contracture of the MPJ.  I will also excise out the skin lesion/perform an ulcer ectomy. I discussed my postoperative protocol in extensive detail which includes weightbearing as tolerated in the boot patient states understanding.  I will plan not to use a K wire if the ulcer still present.  -Informed surgical risk consent was reviewed and read aloud to the patient.  I reviewed the films.  I have discussed my findings with the patient in great detail.  I have discussed all risks including but not limited to infection, stiffness, scarring, limp, disability, deformity, damage to blood vessels and nerves, numbness, poor healing, need for braces, arthritis, chronic pain, amputation, death.  All benefits and realistic expectations discussed in great detail.  I have made no promises as to the outcome.  I have provided realistic expectations.  I have offered the patient a 2nd opinion, which they have declined and assured me they preferred to proceed despite the risks -A total of 33 minutes was  spent in direct patient care as well as pre and post patient encounter activities.  This includes documentation as well as reviewing patient chart for labs, imaging, past medical, surgical, social, and family history as documented in the EMR.  I have reviewed medication allergies as documented in EMR.  I discussed the etiology of condition and treatment options from conservative to surgical care.  All risks and benefit of the treatment course was discussed in detail.  All questions were answered and return appointment was discussed.  Since the visit completed in an ambulatory/outpatient setting, the patient and/or parent/guardian has been advised to contact the providers office for worsening condition and seek medical treatment and/or call 911 if the patient deems either is necessary.   Procedure: Excisional Debridement of Wound right third digit~stagnant Tool: Sharp chisel blade/tissue nipper Rationale: Removal of non-viable soft tissue from the wound to promote healing.  Anesthesia: none Pre-Debridement Wound Measurements: 0.2 cm x 0.2 cm x 0.1 cm  Post-Debridement Wound Measurements: 0.3 cm x 0.3 cm x 0.1 cm  Type of Debridement: Sharp Excisional Tissue Removed: Non-viable soft tissue Blood loss: Minimal (<50cc) Depth of Debridement: subcutaneous tissue. Technique: Sharp excisional debridement to bleeding, viable wound base.  Wound  Progress: The wound is stagnant. Dressing: Dry, sterile, compression dressing. Disposition: Patient tolerated procedure well. Patient to return in 1 week for follow-up.   No follow-ups on file.

## 2020-12-05 ENCOUNTER — Ambulatory Visit: Payer: 59 | Admitting: Podiatry

## 2020-12-05 ENCOUNTER — Encounter: Payer: Self-pay | Admitting: Podiatry

## 2020-12-05 ENCOUNTER — Other Ambulatory Visit: Payer: Self-pay

## 2020-12-05 DIAGNOSIS — L97511 Non-pressure chronic ulcer of other part of right foot limited to breakdown of skin: Secondary | ICD-10-CM

## 2020-12-05 DIAGNOSIS — M2041 Other hammer toe(s) (acquired), right foot: Secondary | ICD-10-CM | POA: Diagnosis not present

## 2020-12-06 ENCOUNTER — Encounter: Payer: Self-pay | Admitting: Podiatry

## 2020-12-06 NOTE — Progress Notes (Signed)
Subjective:  Patient ID: John Singh, male    DOB: 09-Jul-1971,  MRN: [id number]  Chief Complaint  Patient presents with  . Foot Ulcer    Follow up ulcer 3rd toe right   "Its doing better, just real slow to heal"    50 y.o. male presents for wound care.  Patient presents with follow-up of right third digit distal ulceration.  Patient states that the ulcer may have healed.  However he still has surgery scheduled for the end of this month.  He just wanted to get it evaluated given that the surgery has moved.  Review of Systems: Negative except as noted in the HPI. Denies N/V/F/Ch.  Past Medical History:  Diagnosis Date  . Degenerative joint disease (DJD) of hip   . Gout   . Hypertension     Current Outpatient Medications:  .  Carbomer Gel Base (HYDROGEL) GEL, 1 inch by Does not apply route daily., Disp: 100 g, Rfl: 0 .  colchicine 0.6 MG tablet, Take 1 tablet (0.6 mg total) by mouth daily., Disp: 30 tablet, Rfl: 0 .  collagenase (SANTYL) ointment, Apply 1 application topically daily., Disp: 15 g, Rfl: 0 .  losartan (COZAAR) 100 MG tablet, Take 1 tablet (100 mg total) by mouth daily., Disp: 90 tablet, Rfl: 3 .  PARoxetine (PAXIL-CR) 37.5 MG 24 hr tablet, TAKE 1 TABLET(37.5 MG) BY MOUTH DAILY, Disp: 100 tablet, Rfl: 0  Social History   Tobacco Use  Smoking Status Never Smoker  Smokeless Tobacco Never Used    No Known Allergies Objective:  There were no vitals filed for this visit. There is no height or weight on file to calculate BMI. Constitutional Well developed. Well nourished.  Vascular Dorsalis pedis pulses palpable bilaterally. Posterior tibial pulses palpable bilaterally. Capillary refill normal to all digits.  No cyanosis or clubbing noted. Pedal hair growth normal.  Neurologic Normal speech. Oriented to person, place, and time. Protective sensation absent  Dermatologic Wound Location: Right distal third digit ulceration limited to the breakdown of the skin.   Does not probe to bone.  Beside erythema no other clinical signs of infection present no malodor present Wound Base: Mixed Granular/Fibrotic Peri-wound: Normal Exudate: Scant/small amount Serous exudate Wound Measurements: -See below  Left submetatarsal 4 ulceration clinically reepithelialized. No concern for wound noted. No concern for infection noted.  Good correction alignment and offloading noted of the hammertoe contracture right third digit  Distal tip ulceration  Orthopedic: No pain to palpation either foot.   Radiographs: Previous x-rays were evaluated that were taken 2 weeks ago which showed no signs of osteomyelitis. Assessment:   1. Right foot ulcer, limited to breakdown of skin (HCC)   2. Hammertoe of right foot    Plan:  Patient was evaluated and treated and all questions answered.  Hammertoe right third digit with pre-ulcerative callus/ulcer~recurrence -Clinically healed the flexor tenotomy site. No further wound noted. Improvement in good correction and reduction noted..  Left submetatarsal four ulceration -Clinically reepithelialized. No concern for another ulceration noted. If it continues to recurs we will discuss floating osteotomies at that time.  Ulcer right third digit ulceration limited to the breakdown of the skin~stagnant -Debridement as below. -Dressed with Betadine wet-to-dry dressings -Continue off-loading with surgical shoe. -Redness improved with doxycycline -His A1c was reviewed which was within normal limits at 5.4.  He does not have diabetes I feel comfortable proceeding with surgical intervention to help correct the right third digit hammertoe with arthroplasty of the digit with possible  capsulotomy of this second metatarsophalangeal joint.  The reason why believe patient will benefit from this procedure as there is excessive pressure to the distal tip of the third digit leading to ulcer formation.  Patient has a high risk of losing the digit if the  ulcer continues to progress well or worsens.  At this time patient will benefit from arthroplasty without any kind of fixation to take the pressure off of distal tip of the digit.  I discussed with the patient that he is at a higher risk of losing the digit.  Patient states understanding would like to proceed with the surgery to correct the hammertoe as well as address the joint contracture of the MPJ.  I will also excise out the skin lesion/perform an ulcer ectomy. I discussed my postoperative protocol in extensive detail which includes weightbearing as tolerated in the boot patient states understanding.  I will plan not to use a K wire if the ulcer still present.  -Informed surgical risk consent was reviewed and read aloud to the patient.  I reviewed the films.  I have discussed my findings with the patient in great detail.  I have discussed all risks including but not limited to infection, stiffness, scarring, limp, disability, deformity, damage to blood vessels and nerves, numbness, poor healing, need for braces, arthritis, chronic pain, amputation, death.  All benefits and realistic expectations discussed in great detail.  I have made no promises as to the outcome.  I have provided realistic expectations.  I have offered the patient a 2nd opinion, which they have declined and assured me they preferred to proceed despite the risks -A total of 33 minutes was spent in direct patient care as well as pre and post patient encounter activities.  This includes documentation as well as reviewing patient chart for labs, imaging, past medical, surgical, social, and family history as documented in the EMR.  I have reviewed medication allergies as documented in EMR.  I discussed the etiology of condition and treatment options from conservative to surgical care.  All risks and benefit of the treatment course was discussed in detail.  All questions were answered and return appointment was discussed.  Since the visit  completed in an ambulatory/outpatient setting, the patient and/or parent/guardian has been advised to contact the providers office for worsening condition and seek medical treatment and/or call 911 if the patient deems either is necessary.   Procedure: Excisional Debridement of Wound right third digit~stagnant Tool: Sharp chisel blade/tissue nipper Rationale: Removal of non-viable soft tissue from the wound to promote healing.  Anesthesia: none Pre-Debridement Wound Measurements: 0.2 cm x 0.2 cm x 0.1 cm  Post-Debridement Wound Measurements: 0.3 cm x 0.3 cm x 0.1 cm  Type of Debridement: Sharp Excisional Tissue Removed: Non-viable soft tissue Blood loss: Minimal (<50cc) Depth of Debridement: subcutaneous tissue. Technique: Sharp excisional debridement to bleeding, viable wound base.  Wound Progress: The wound is stagnant. Dressing: Dry, sterile, compression dressing. Disposition: Patient tolerated procedure well. Patient to return in 1 week for follow-up.   No follow-ups on file.

## 2020-12-21 ENCOUNTER — Telehealth: Payer: Self-pay

## 2020-12-21 NOTE — Telephone Encounter (Signed)
DOS 12/31/2020  CAPSULOTOMY, MPJ RELEASE 3RD RT - 28270 HAMMERTOE REPAIR 3RD RT - 28285   [facility] EFFECTIVE DATE - 11/03/2020  PLAN DEDUCTIBLE - $2000.00 W/ $2878.67 REMAINING OUT OF POCKET - $6000.00 W/ $5868.61 REMAINING COPAY $0.00 COINSURANCE - 20%    NOTIFICATION/PRIOR AUTHORIZATION NUMBER CASE STATUS CASE STATUS REASON PRIMARY CARE PHYSICIAN [id number] Closed Case Was Managed And Is Now Complete - ADVANCE NOTIFY DATE/TIME ADMISSION NOTIFY DATE/TIME 12/21/2020 10:29 AM CST - COVERAGE STATUS OVERALL COVERAGE STATUS Covered/Approved 1-2 CODE DESCRIPTION COVERAGE STATUS DECISION DATE FAC [facility] Coverage determination is reflected for the facility admission and is not a guarantee of payment for ongoing services. Covered/Approved 12/21/2020 1 28270 Capsulotomy; metatarsophalangeal joint, more Covered/Approved 12/21/2020 2 28285 Correction, hammertoe (eg, interphalange more Covered/Approved 12/21/2020

## 2020-12-31 ENCOUNTER — Other Ambulatory Visit: Payer: Self-pay | Admitting: Podiatry

## 2020-12-31 DIAGNOSIS — M2041 Other hammer toe(s) (acquired), right foot: Secondary | ICD-10-CM | POA: Diagnosis not present

## 2020-12-31 MED ORDER — IBUPROFEN 800 MG PO TABS: 800.0000 mg | ORAL_TABLET | Freq: Four times a day (QID) | ORAL | 1 refills | Status: DC | PRN

## 2020-12-31 MED ORDER — OXYCODONE-ACETAMINOPHEN 5-325 MG PO TABS: 1.0000 | ORAL_TABLET | ORAL | 0 refills | Status: DC | PRN

## 2021-01-09 ENCOUNTER — Ambulatory Visit (INDEPENDENT_AMBULATORY_CARE_PROVIDER_SITE_OTHER): Payer: 59 | Admitting: Podiatry

## 2021-01-09 ENCOUNTER — Encounter: Payer: Self-pay | Admitting: Podiatry

## 2021-01-09 ENCOUNTER — Ambulatory Visit (INDEPENDENT_AMBULATORY_CARE_PROVIDER_SITE_OTHER): Payer: 59

## 2021-01-09 ENCOUNTER — Other Ambulatory Visit: Payer: Self-pay

## 2021-01-09 DIAGNOSIS — M2041 Other hammer toe(s) (acquired), right foot: Secondary | ICD-10-CM

## 2021-01-09 DIAGNOSIS — Z9889 Other specified postprocedural states: Secondary | ICD-10-CM | POA: Diagnosis not present

## 2021-01-09 NOTE — Progress Notes (Signed)
  Subjective:  Patient ID: John Singh, male    DOB: [birth year],  MRN: [id number]  Chief Complaint  Patient presents with  . Routine Post Op    POST OP 2.28.22     Procedure: Right third digit arthroplasty  50 y.o. male returns for post-op check.  Patient states he is doing well.  No pain.  The stitches are intact.  He has been keeping bandages clean dry and intact.  He is ambulating with a surgical shoe.  Review of Systems: Negative except as noted in the HPI. Denies N/V/F/Ch.  Past Medical History:  Diagnosis Date  . Degenerative joint disease (DJD) of hip   . Gout   . Hypertension     Current Outpatient Medications:  .  Carbomer Gel Base (HYDROGEL) GEL, 1 inch by Does not apply route daily., Disp: 100 g, Rfl: 0 .  colchicine 0.6 MG tablet, Take 1 tablet (0.6 mg total) by mouth daily., Disp: 30 tablet, Rfl: 0 .  collagenase (SANTYL) ointment, Apply 1 application topically daily., Disp: 15 g, Rfl: 0 .  ibuprofen (ADVIL) 800 MG tablet, Take 1 tablet (800 mg total) by mouth every 6 (six) hours as needed., Disp: 60 tablet, Rfl: 1 .  losartan (COZAAR) 100 MG tablet, Take 1 tablet (100 mg total) by mouth daily., Disp: 90 tablet, Rfl: 3 .  oxyCODONE-acetaminophen (PERCOCET) 5-325 MG tablet, Take 1-2 tablets by mouth every 4 (four) hours as needed for severe pain., Disp: 30 tablet, Rfl: 0 .  PARoxetine (PAXIL-CR) 37.5 MG 24 hr tablet, TAKE 1 TABLET(37.5 MG) BY MOUTH DAILY, Disp: 100 tablet, Rfl: 0  Social History   Tobacco Use  Smoking Status Never Smoker  Smokeless Tobacco Never Used    No Known Allergies Objective:  There were no vitals filed for this visit. There is no height or weight on file to calculate BMI. Constitutional Well developed. Well nourished.  Vascular Foot warm and well perfused. Capillary refill normal to all digits.   Neurologic Normal speech. Oriented to person, place, and time. Epicritic sensation to light touch grossly present bilaterally.   Dermatologic Skin healing well without signs of infection. Skin edges well coapted without signs of infection.  Orthopedic: Tenderness to palpation noted about the surgical site.   Radiographs: 3 views skeletally mature adult of right foot: Good correction alignment noted of the right third digit. Assessment:   1. Status post surgery   2. Hammertoe of right foot    Plan:  Patient was evaluated and treated and all questions answered.  S/p foot surgery right -Progressing as expected post-operatively. -XR: See above -WB Status: Weightbearing as tolerated in surgical shoe -Sutures: Intact.  No clinical signs of dehiscence noted no complication noted. -Medications: None -Foot redressed.  No follow-ups on file.

## 2021-01-23 ENCOUNTER — Ambulatory Visit (INDEPENDENT_AMBULATORY_CARE_PROVIDER_SITE_OTHER): Payer: 59 | Admitting: Podiatry

## 2021-01-23 ENCOUNTER — Other Ambulatory Visit: Payer: Self-pay

## 2021-01-23 DIAGNOSIS — M2041 Other hammer toe(s) (acquired), right foot: Secondary | ICD-10-CM

## 2021-01-23 DIAGNOSIS — Z9889 Other specified postprocedural states: Secondary | ICD-10-CM

## 2021-01-24 ENCOUNTER — Encounter: Payer: Self-pay | Admitting: Podiatry

## 2021-01-24 NOTE — Progress Notes (Signed)
  Subjective:  Patient ID: John Singh, male    DOB: [birth year],  MRN: [id number]  Chief Complaint  Patient presents with  . Routine Post Op    POST OP      Procedure: Right third digit arthroplasty  50 y.o. male returns for post-op check.  Patient states he is doing well.  No pain.  The stitches are intact.  He has been keeping bandages clean dry and intact.  He is ambulating with a surgical shoe.  Review of Systems: Negative except as noted in the HPI. Denies N/V/F/Ch.  Past Medical History:  Diagnosis Date  . Degenerative joint disease (DJD) of hip   . Gout   . Hypertension     Current Outpatient Medications:  .  Carbomer Gel Base (HYDROGEL) GEL, 1 inch by Does not apply route daily., Disp: 100 g, Rfl: 0 .  colchicine 0.6 MG tablet, Take 1 tablet (0.6 mg total) by mouth daily., Disp: 30 tablet, Rfl: 0 .  collagenase (SANTYL) ointment, Apply 1 application topically daily., Disp: 15 g, Rfl: 0 .  ibuprofen (ADVIL) 800 MG tablet, Take 1 tablet (800 mg total) by mouth every 6 (six) hours as needed., Disp: 60 tablet, Rfl: 1 .  losartan (COZAAR) 100 MG tablet, Take 1 tablet (100 mg total) by mouth daily., Disp: 90 tablet, Rfl: 3 .  oxyCODONE-acetaminophen (PERCOCET) 5-325 MG tablet, Take 1-2 tablets by mouth every 4 (four) hours as needed for severe pain., Disp: 30 tablet, Rfl: 0 .  PARoxetine (PAXIL-CR) 37.5 MG 24 hr tablet, TAKE 1 TABLET(37.5 MG) BY MOUTH DAILY, Disp: 100 tablet, Rfl: 0  Social History   Tobacco Use  Smoking Status Never Smoker  Smokeless Tobacco Never Used    No Known Allergies Objective:  There were no vitals filed for this visit. There is no height or weight on file to calculate BMI. Constitutional Well developed. Well nourished.  Vascular Foot warm and well perfused. Capillary refill normal to all digits.   Neurologic Normal speech. Oriented to person, place, and time. Epicritic sensation to light touch grossly present bilaterally.  Dermatologic   skin completely reepithelialized.  Good correction alignment noted of the hammertoe.  It appears that there is stranding of the toe taking pressure away from the distal tip.  Orthopedic:  No tenderness to palpation noted about the surgical site.   Radiographs: 3 views skeletally mature adult of right foot: Good correction alignment noted of the right third digit. Assessment:   1. Status post surgery   2. Hammertoe of right foot    Plan:  Patient was evaluated and treated and all questions answered.  S/p foot surgery right -Progressing as expected post-operatively. -XR: See above -WB Status: Weightbearing as tolerated in surgical shoe -Sutures: Removed no clinical signs of dehiscence noted no complication noted. -Medications: None -At this time patient can begin transition to regular shoes as the skin has completely reepithelialized without any signs of ulceration.  I will see him back for the final visit in 4 to 6 weeks to make sure that there is no recurrence of ulceration.  Patient states understanding  No follow-ups on file.

## 2021-03-06 ENCOUNTER — Ambulatory Visit (INDEPENDENT_AMBULATORY_CARE_PROVIDER_SITE_OTHER): Payer: 59

## 2021-03-06 ENCOUNTER — Encounter: Payer: Self-pay | Admitting: Podiatry

## 2021-03-06 ENCOUNTER — Other Ambulatory Visit: Payer: Self-pay

## 2021-03-06 ENCOUNTER — Ambulatory Visit (INDEPENDENT_AMBULATORY_CARE_PROVIDER_SITE_OTHER): Payer: 59 | Admitting: Podiatry

## 2021-03-06 DIAGNOSIS — Z9889 Other specified postprocedural states: Secondary | ICD-10-CM | POA: Diagnosis not present

## 2021-03-06 DIAGNOSIS — M2041 Other hammer toe(s) (acquired), right foot: Secondary | ICD-10-CM

## 2021-03-06 NOTE — Progress Notes (Signed)
  Subjective:  Patient ID: John Singh, male    DOB: [birth year],  MRN: [id number]  Chief Complaint  Patient presents with  . Routine Post Op    POST OP DOS 2.28.22 HAMMER TOE CORRECTION     Procedure: Right third digit arthroplasty  50 y.o. male returns for post-op check.  Patient states he is doing well.  He does not have any pain.  He there is no recurrence of distal callus.  He is ambulating in regular shoes without any complaints  Review of Systems: Negative except as noted in the HPI. Denies N/V/F/Ch.  Past Medical History:  Diagnosis Date  . Degenerative joint disease (DJD) of hip   . Gout   . Hypertension     Current Outpatient Medications:  .  Carbomer Gel Base (HYDROGEL) GEL, 1 inch by Does not apply route daily., Disp: 100 g, Rfl: 0 .  colchicine 0.6 MG tablet, Take 1 tablet (0.6 mg total) by mouth daily., Disp: 30 tablet, Rfl: 0 .  collagenase (SANTYL) ointment, Apply 1 application topically daily., Disp: 15 g, Rfl: 0 .  ibuprofen (ADVIL) 800 MG tablet, Take 1 tablet (800 mg total) by mouth every 6 (six) hours as needed., Disp: 60 tablet, Rfl: 1 .  losartan (COZAAR) 100 MG tablet, Take 1 tablet (100 mg total) by mouth daily., Disp: 90 tablet, Rfl: 3 .  oxyCODONE-acetaminophen (PERCOCET) 5-325 MG tablet, Take 1-2 tablets by mouth every 4 (four) hours as needed for severe pain., Disp: 30 tablet, Rfl: 0 .  PARoxetine (PAXIL-CR) 37.5 MG 24 hr tablet, TAKE 1 TABLET(37.5 MG) BY MOUTH DAILY, Disp: 100 tablet, Rfl: 0  Social History   Tobacco Use  Smoking Status Never Smoker  Smokeless Tobacco Never Used    No Known Allergies Objective:  There were no vitals filed for this visit. There is no height or weight on file to calculate BMI. Constitutional Well developed. Well nourished.  Vascular Foot warm and well perfused. Capillary refill normal to all digits.   Neurologic Normal speech. Oriented to person, place, and time. Epicritic sensation to light touch grossly  present bilaterally.  Dermatologic  skin completely reepithelialized.  Good correction alignment noted of the hammertoe.  It appears that there is stranding of the toe taking pressure away from the distal tip.  Orthopedic:  No tenderness to palpation noted about the surgical site.   Radiographs: 3 views skeletally mature adult of right foot: Good correction alignment noted of the right third digit. Assessment:   1. Status post surgery   2. Hammertoe of right foot    Plan:  Patient was evaluated and treated and all questions answered.  S/p foot surgery right -Progressing as expected post-operatively. -XR: See above -WB Status: Weightbearing as tolerated in surgical shoe -Sutures: Removed no clinical signs of dehiscence noted no complication noted. -Medications: None -Clinically the distal ulceration has completely epithelialized.  At this time patient is officially discharged from my care.  If any foot and ankle issues arise to come back and see me.  He states understanding  No follow-ups on file.

## 2021-06-04 ENCOUNTER — Other Ambulatory Visit: Payer: Self-pay

## 2021-06-05 ENCOUNTER — Ambulatory Visit: Payer: 59 | Admitting: Family Medicine

## 2021-06-05 ENCOUNTER — Encounter: Payer: Self-pay | Admitting: Family Medicine

## 2021-06-05 VITALS — BP 134/78 | HR 63 | Temp 97.5°F | Ht 73.0 in | Wt 200.0 lb

## 2021-06-05 DIAGNOSIS — M7041 Prepatellar bursitis, right knee: Secondary | ICD-10-CM | POA: Diagnosis not present

## 2021-06-05 NOTE — Progress Notes (Signed)
[facility] [address] [place] [address] Dept: [phone] Dept Fax: [phone]  Office Visit  Subjective:    Patient ID: John Singh, male    DOB: 10/25/1971, 50 y.o..   MRN: [id number]  Chief Complaint  Patient presents with   Acute Visit    C/o having pain below the RT knee x 10 days.  No OTC meds taken.     History of Present Illness:  Patient is in today for assessment of right knee pain. He notes this started about 10 days ago. He had a day where he was doing repairs under the house, so kneeling quite a bit. He indicates the pain as just below the knee joint. He notes this hurts more after he has been sitting for several minutes. He has a history of gout. He did take colchicine over the past 3 days, but did not note improvement.  Past Medical History: Patient Active Problem List   Diagnosis Date Noted   Medication side effect 12/30/2019   Alcoholic peripheral neuropathy (HCC) 03/21/2019   Hammer toe of right foot 02/16/2019   Pre-ulcerative corn or callous 02/16/2019   Elevated LDL cholesterol level 06/14/2018   Encounter for health maintenance examination with abnormal findings 05/14/2018   History of avascular necrosis of capital femoral epiphysis 05/14/2018   Alcohol use 05/14/2018   Depression, recurrent (HCC) 05/14/2018   Gout of knee 04/29/2017   Elevated LFTs 04/29/2017   Essential hypertension 02/28/2017   Left knee pain 02/14/2014   Left hip pain 02/14/2014   Past Surgical History:  Procedure Laterality Date   JOINT REPLACEMENT     TOTAL HIP ARTHROPLASTY     Family History  Problem Relation Age of Onset   Heart disease Mother    Alcohol abuse Father    Alcohol abuse Brother    Drug abuse Brother    Outpatient Medications Prior to Visit  Medication Sig Dispense Refill   colchicine 0.6 MG tablet Take 1 tablet (0.6 mg total) by mouth daily. 30 tablet 0   losartan (COZAAR) 100 MG tablet  Take 1 tablet (100 mg total) by mouth daily. 90 tablet 3   PARoxetine (PAXIL-CR) 37.5 MG 24 hr tablet TAKE 1 TABLET(37.5 MG) BY MOUTH DAILY 100 tablet 0   Carbomer Gel Base (HYDROGEL) GEL 1 inch by Does not apply route daily. (Patient not taking: Reported on 06/05/2021) 100 g 0   collagenase (SANTYL) ointment Apply 1 application topically daily. (Patient not taking: Reported on 06/05/2021) 15 g 0   ibuprofen (ADVIL) 800 MG tablet Take 1 tablet (800 mg total) by mouth every 6 (six) hours as needed. (Patient not taking: Reported on 06/05/2021) 60 tablet 1   oxyCODONE-acetaminophen (PERCOCET) 5-325 MG tablet Take 1-2 tablets by mouth every 4 (four) hours as needed for severe pain. (Patient not taking: Reported on 06/05/2021) 30 tablet 0   No facility-administered medications prior to visit.   No Known Allergies    Objective:   Today's Vitals   06/05/21 1132  BP: 134/78  Pulse: 63  Temp: (!) 97.5 F (36.4 C)  TempSrc: Temporal  SpO2: 99%  Weight: 200 lb (90.7 kg)  Height: 6\' 1"  (1.854 m)   Body mass index is 26.39 kg/m.   General: Well developed, well nourished. No acute distress. Extremities: Full ROM. There is mild swelling, increased warmth, and tenderness over the patellar tendon   below the knee. Knee extensor strength is normal. Varus/valgus, Lachman's and McMurtry's testing  are   normal.  Psych: Alert and oriented. Normal mood and affect.  Health Maintenance Due  Topic Date Due   COVID-19 Vaccine (1) Never done   HIV Screening  Never done   Hepatitis C Screening  Never done   TETANUS/TDAP  Never done   INFLUENZA VACCINE  06/03/2021     Assessment & Plan:   1. Prepatellar bursitis of right knee Recommend he use Aleve 220 mg 1 tablet twice a day for 14 days. He should apply heat to the elevated knee int he evenings. If he needs to do any work on his knees, he should use knee pads. Follow-up in 2 weeks if not improving.  Loyola Mast, MD

## 2021-06-05 NOTE — Patient Instructions (Signed)
Use Aleve 220 mg 1 tablet twice a day for 14 days.  Prepatellar Bursitis  Prepatellar bursitis is inflammation of the prepatellar bursa, which is a fluid-filled sac that cushions the kneecap (patella). Prepatellar bursitis happens when fluid builds up in this sac and causes itto swell. The condition causes knee pain. What are the causes? This condition may be caused by: Constant pressure on the knees from kneeling. A hit to the knee. Falling on the knee. Infection from bacteria. Moving the knee often in a forceful way. What increases the risk? You are more likely to develop this condition if: You play sports that have a high risk of falling on the knee or being hit on the knee. These include football, wrestling, basketball, or soccer. You do work in which you kneel for long periods of time, such as roofing, plumbing, or gardening. You have another inflammatory condition, such as gout or rheumatoid arthritis. What are the signs or symptoms? The most common symptom of this condition is knee pain that gets better with rest. Other symptoms include: Swelling on the front of the kneecap. Warmth in the knee. Tenderness with activity. Redness in the knee. Inability to bend the knee or to kneel. How is this diagnosed? This condition is diagnosed based on: A physical exam. Your health care provider will compare your knees and check for tenderness and pain while moving your knee. Your medical history. Tests to check for infection. These may include blood tests and tests on the fluid in the bursa. Imaging tests, such as X-ray, MRI, or ultrasound, to check for damage in the patella, or fluid buildup and swelling in the bursa. How is this treated? This condition may be treated by: Resting the knee. Putting ice on the knee. Taking medicines, such as: NSAIDs. These can help to reduce pain and swelling. Antibiotics. These may be needed if you have an infection. Steroids. These are used to reduce  swelling and inflammation, and may be prescribed if other treatments are not helping. Raising (elevating) the knee while resting. Doing exercises to help you maintain movement (physical therapy). These may be recommended after pain and swelling improve. Having a procedure to remove fluid from the bursa. This may be done if other treatments are not helping. Having surgery to remove the bursa. This may be done if you have a severe infection or if the condition keeps coming back after treatment. Follow these instructions at home: Medicines Take over-the-counter and prescription medicines only as told by your health care provider. If you were prescribed an antibiotic medicine, take it as told by your health care provider. Do not stop taking the antibiotic even if you start to feel better. Managing pain, stiffness, and swelling  If directed, put ice on the injured area. Put ice in a plastic bag. Place a towel between your skin and the bag. Leave the ice on for 20 minutes, 2-3 times a day. Elevate the injured area above the level of your heart while you are sitting or lying down.  Activity Do not use the injured limb to support your body weight until your health care provider says that you can. Rest your knee. Avoid activities that cause knee pain. Return to your normal activities as told by your health care provider. Ask your health care provider what activities are safe for you. Do exercises as told by your health care provider. General instructions Ask your health care provider when it is safe for you to drive. Do not use any products  that contain nicotine or tobacco, such as cigarettes, e-cigarettes, and chewing tobacco. These can delay healing. If you need help quitting, ask your health care provider. Keep all follow-up visits as told by your health care provider. This is important. How is this prevented? Warm up and stretch before being active. Cool down and stretch after being  active. Give your body time to rest between periods of activity. Maintain physical fitness, including strength and flexibility. Be safe and responsible while being active. This will help you to avoid falls. Wear knee pads if you have to kneel for a long period of time. Contact a health care provider if: Your symptoms do not improve or get worse. Your symptoms keep coming back after treatment. You develop a fever and have warmth, redness, or swelling over your knee. Summary Prepatellar bursitis is inflammation of the prepatellar bursa, which is a fluid-filled sac that cushions the kneecap (patella). This condition may be caused by injury or constant pressure on the knee. It may also be caused by an infection from bacteria. Symptoms of this condition include pain, swelling, warmth, and tenderness in the knee. Follow instructions from your health care provider about taking medicines, resting, and doing activities. Contact your health care provider if your symptoms do not improve, get worse, or keep coming back after treatment. This information is not intended to replace advice given to you by your health care provider. Make sure you discuss any questions you have with your healthcare provider. Document Revised: 02/11/2019 Document Reviewed: 12/30/2018 Elsevier Patient Education  2022 [employer].

## 2021-07-12 ENCOUNTER — Other Ambulatory Visit: Payer: Self-pay | Admitting: Family Medicine

## 2021-07-12 DIAGNOSIS — F339 Major depressive disorder, recurrent, unspecified: Secondary | ICD-10-CM

## 2021-08-09 ENCOUNTER — Ambulatory Visit (INDEPENDENT_AMBULATORY_CARE_PROVIDER_SITE_OTHER): Payer: 59

## 2021-08-09 ENCOUNTER — Other Ambulatory Visit: Payer: Self-pay

## 2021-08-09 ENCOUNTER — Ambulatory Visit: Payer: 59 | Admitting: Podiatry

## 2021-08-09 DIAGNOSIS — S80822A Blister (nonthermal), left lower leg, initial encounter: Secondary | ICD-10-CM

## 2021-08-09 MED ORDER — DOXYCYCLINE HYCLATE 100 MG PO TABS: 100.0000 mg | ORAL_TABLET | Freq: Two times a day (BID) | ORAL | 0 refills | Status: AC

## 2021-08-09 NOTE — Progress Notes (Signed)
Subjective:  Patient ID: John Singh, male    DOB: [birth year],  MRN: [id number]  Chief Complaint  Patient presents with   Toe Pain    Left foot swelling in toes and redness skin peeling     50 y.o. male presents with the above complaint.  Patient presents with new complaint of left 1 through 5 friction blister.  Patient states that the foot had gotten wet and he put some bandage on it which led to blistering of the other toes.  Originally started with a big toe on the progressive got worse.  Now there is redness present up to the MPJ.  He does not any nausea fever chills vomiting.  He is got a friction blister with redness and some of them have deroofed.  He denies any other acute complaints he has not seen and was prior to seeing me.  He would like to discuss treatment options.   Review of Systems: Negative except as noted in the HPI. Denies N/V/F/Ch.  Past Medical History:  Diagnosis Date   Degenerative joint disease (DJD) of hip    Gout    Hypertension     Current Outpatient Medications:    doxycycline (VIBRA-TABS) 100 MG tablet, Take 1 tablet (100 mg total) by mouth 2 (two) times daily for 14 days., Disp: 28 tablet, Rfl: 0   colchicine 0.6 MG tablet, Take 1 tablet (0.6 mg total) by mouth daily., Disp: 30 tablet, Rfl: 0   losartan (COZAAR) 100 MG tablet, Take 1 tablet (100 mg total) by mouth daily., Disp: 90 tablet, Rfl: 3   PARoxetine (PAXIL-CR) 37.5 MG 24 hr tablet, TAKE 1 TABLET(37.5 MG) BY MOUTH DAILY, Disp: 30 tablet, Rfl: 1  Social History   Tobacco Use  Smoking Status Never  Smokeless Tobacco Never    No Known Allergies Objective:  There were no vitals filed for this visit. There is no height or weight on file to calculate BMI. Constitutional Well developed. Well nourished.  Vascular Dorsalis pedis pulses palpable bilaterally. Posterior tibial pulses palpable bilaterally. Capillary refill normal to all digits.  No cyanosis or clubbing noted. Pedal hair growth  normal.  Neurologic Normal speech. Oriented to person, place, and time. Epicritic sensation to light touch grossly present bilaterally.  Dermatologic Superficial friction blister noted to digits 1 through 5 circumferentially to the distal tip likely due to previous dressing causing friction in between the toes and around the toes.  Erythema noted up to the level of the metatarsophalangeal joint.  No streaking cellulitis noted.  No malodor present.  No purulent drainage noted.  Orthopedic: Normal joint ROM without pain or crepitus bilaterally. No visible deformities. No bony tenderness.   Radiographs: None Assessment:   1. Friction blister of left lower extremity, initial encounter    Plan:  Patient was evaluated and treated and all questions answered.  Left 1 through 5 friction blister -I explained the patient the etiology of friction blister and various treatment options were discussed.  This is confined to the distal tip with erythema up to the level of the metatarsophalangeal joint.  At this time I believe patient will benefit from doxycycline antibiotics for 14 days to help with the redness as well as aggressive Betadine wet-to-dry dressing changes.  Patient agrees with the plan would like to proceed with that.  I asked him to place himself in the surgical shoe.  I also discussed with him if he notices any streaking redness to go to the emergency room right away.  He states understanding. -Doxycycline was dispensed  No follow-ups on file.

## 2021-08-23 ENCOUNTER — Other Ambulatory Visit: Payer: Self-pay

## 2021-08-23 ENCOUNTER — Ambulatory Visit: Payer: 59 | Admitting: Podiatry

## 2021-08-23 DIAGNOSIS — S80822A Blister (nonthermal), left lower leg, initial encounter: Secondary | ICD-10-CM | POA: Diagnosis not present

## 2021-08-23 MED ORDER — DOXYCYCLINE HYCLATE 100 MG PO TABS: 100.0000 mg | ORAL_TABLET | Freq: Two times a day (BID) | ORAL | 0 refills | Status: DC

## 2021-08-28 NOTE — Progress Notes (Signed)
  Subjective:  Patient ID: John Singh, male    DOB: [birth year],  MRN: [id number]  Chief Complaint  Patient presents with   Foot Injury    Left foot friction blister follow up     50 y.o. male presents with the above complaint.  Patient presents with new complaint of left 1 through 5 friction blister.  He states is looking about the same and he is worried that he may have gotten little bit worse.  He has run out of his doxycycline he denies any other acute complaints.   Review of Systems: Negative except as noted in the HPI. Denies N/V/F/Ch.  Past Medical History:  Diagnosis Date   Degenerative joint disease (DJD) of hip    Gout    Hypertension     Current Outpatient Medications:    doxycycline (VIBRA-TABS) 100 MG tablet, Take 1 tablet (100 mg total) by mouth 2 (two) times daily., Disp: 60 tablet, Rfl: 0   colchicine 0.6 MG tablet, Take 1 tablet (0.6 mg total) by mouth daily., Disp: 30 tablet, Rfl: 0   losartan (COZAAR) 100 MG tablet, Take 1 tablet (100 mg total) by mouth daily., Disp: 90 tablet, Rfl: 3   PARoxetine (PAXIL-CR) 37.5 MG 24 hr tablet, TAKE 1 TABLET(37.5 MG) BY MOUTH DAILY, Disp: 30 tablet, Rfl: 1  Social History   Tobacco Use  Smoking Status Never  Smokeless Tobacco Never    No Known Allergies Objective:  There were no vitals filed for this visit. There is no height or weight on file to calculate BMI. Constitutional Well developed. Well nourished.  Vascular Dorsalis pedis pulses palpable bilaterally. Posterior tibial pulses palpable bilaterally. Capillary refill normal to all digits.  No cyanosis or clubbing noted. Pedal hair growth normal.  Neurologic Normal speech. Oriented to person, place, and time. Epicritic sensation to light touch grossly present bilaterally.  Dermatologic Superficial friction blister noted to digits 1 through 5 circumferentially to the distal tip likely due to previous dressing causing friction in between the toes and around the  toes.  Erythema noted up to the level of the metatarsophalangeal joint.  No streaking cellulitis noted.  No malodor present.  No purulent drainage noted.  Orthopedic: Normal joint ROM without pain or crepitus bilaterally. No visible deformities. No bony tenderness.   Radiographs: None Assessment:   1. Friction blister of left lower extremity, initial encounter     Plan:  Patient was evaluated and treated and all questions answered.  Left 1 through 5 friction blister -I explained the patient the etiology of friction blister and various treatment options were discussed.   -The friction blister appears to be about the same and has not completely resolved.  He has not been as aggressive with the Betadine.  I was asked him to do it once a day aggressively in between the toes as there is a lot of maceration present. -I will continue doxycycline for 30 days until resolve meant  No follow-ups on file.

## 2021-09-04 ENCOUNTER — Other Ambulatory Visit: Payer: Self-pay

## 2021-09-04 ENCOUNTER — Ambulatory Visit: Payer: 59 | Admitting: Podiatry

## 2021-09-04 DIAGNOSIS — S80822A Blister (nonthermal), left lower leg, initial encounter: Secondary | ICD-10-CM | POA: Diagnosis not present

## 2021-09-04 NOTE — Progress Notes (Signed)
  Subjective:  Patient ID: John Singh, male    DOB: 15-Oct-1971,  MRN: [id number]  Chief Complaint  Patient presents with   Foot Pain    Left foot pain     50 y.o. male presents with the above complaint.  Patient presents with new complaint of left 1 through 5 friction blister.  He states is doing better.  The Betadine helps considerably.  He has been doing in between padding with Betadine now.   Review of Systems: Negative except as noted in the HPI. Denies N/V/F/Ch.  Past Medical History:  Diagnosis Date   Degenerative joint disease (DJD) of hip    Gout    Hypertension     Current Outpatient Medications:    colchicine 0.6 MG tablet, Take 1 tablet (0.6 mg total) by mouth daily., Disp: 30 tablet, Rfl: 0   doxycycline (VIBRA-TABS) 100 MG tablet, Take 1 tablet (100 mg total) by mouth 2 (two) times daily., Disp: 60 tablet, Rfl: 0   losartan (COZAAR) 100 MG tablet, Take 1 tablet (100 mg total) by mouth daily., Disp: 90 tablet, Rfl: 3   PARoxetine (PAXIL-CR) 37.5 MG 24 hr tablet, TAKE 1 TABLET(37.5 MG) BY MOUTH DAILY, Disp: 30 tablet, Rfl: 1  Social History   Tobacco Use  Smoking Status Never  Smokeless Tobacco Never    No Known Allergies Objective:  There were no vitals filed for this visit. There is no height or weight on file to calculate BMI. Constitutional Well developed. Well nourished.  Vascular Dorsalis pedis pulses palpable bilaterally. Posterior tibial pulses palpable bilaterally. Capillary refill normal to all digits.  No cyanosis or clubbing noted. Pedal hair growth normal.  Neurologic Normal speech. Oriented to person, place, and time. Epicritic sensation to light touch grossly present bilaterally.  Dermatologic Improving superficial friction blister noted to digits 1 through 5 circumferentially to the distal tip likely due to previous dressing causing friction in between the toes and around the toes.  No further erythema noted up to the level of the  metatarsophalangeal joint.  No streaking cellulitis noted.  No malodor present.  No purulent drainage noted.  Orthopedic: Normal joint ROM without pain or crepitus bilaterally. No visible deformities. No bony tenderness.   Radiographs: None Assessment:   1. Friction blister of left lower extremity, initial encounter      Plan:  Patient was evaluated and treated and all questions answered.  Left 1 through 5 friction blister~improving -I explained the patient the etiology of friction blister and various treatment options were discussed.   -The friction blister appears to be about the same and has not completely resolved.  He has not been as aggressive with the Betadine.  I was asked him to do it once a day aggressively in between the toes as there is a lot of maceration present. -I will continue doxycycline for 30 days until resolve meant  No follow-ups on file.

## 2021-09-25 ENCOUNTER — Ambulatory Visit (INDEPENDENT_AMBULATORY_CARE_PROVIDER_SITE_OTHER): Payer: 59

## 2021-09-25 ENCOUNTER — Other Ambulatory Visit: Payer: Self-pay

## 2021-09-25 ENCOUNTER — Ambulatory Visit: Payer: 59 | Admitting: Podiatry

## 2021-09-25 DIAGNOSIS — S80822A Blister (nonthermal), left lower leg, initial encounter: Secondary | ICD-10-CM | POA: Diagnosis not present

## 2021-09-25 MED ORDER — SULFAMETHOXAZOLE-TRIMETHOPRIM 800-160 MG PO TABS: 1.0000 | ORAL_TABLET | Freq: Two times a day (BID) | ORAL | 0 refills | Status: DC

## 2021-09-25 MED ORDER — TERBINAFINE HCL 250 MG PO TABS: 250.0000 mg | ORAL_TABLET | Freq: Every day | ORAL | 0 refills | Status: DC

## 2021-09-25 MED ORDER — ERYTHROMYCIN 2 % EX PADS: 1.0000 "application " | MEDICATED_PAD | CUTANEOUS | 5 refills | Status: AC

## 2021-09-30 ENCOUNTER — Telehealth: Payer: Self-pay | Admitting: Podiatry

## 2021-09-30 MED ORDER — ERYTHROMYCIN 2 % EX PADS: 1.0000 "application " | MEDICATED_PAD | Freq: Every day | CUTANEOUS | 0 refills | Status: DC

## 2021-09-30 NOTE — Telephone Encounter (Signed)
  Patient called stated that he was to have medicated gauze called in for his toes ?    Ph[facility] : [facility] in [place] on [address]

## 2021-09-30 NOTE — Telephone Encounter (Signed)
Patient called stated that he did not received medicated gauze called in for his toes from the pharmacy    please advise.Marland KitchenMarland KitchenMarland Kitchen

## 2021-09-30 NOTE — Telephone Encounter (Signed)
I called the patient he stated he has received his prescription.

## 2021-10-01 NOTE — Progress Notes (Signed)
  Subjective:  Patient ID: John Singh, male    DOB: 01-18-1971,  MRN: [id number]  Chief Complaint  Patient presents with   friction blisters     Pt stated that he feels like it slowly getting better     50 y.o. male presents with the above complaint.  Patient presents with new complaint of left 1 through 5 friction blister.  He states is doing better.  The Betadine helps considerably.  He has been doing in between padding with Betadine now.   Review of Systems: Negative except as noted in the HPI. Denies N/V/F/Ch.  Past Medical History:  Diagnosis Date   Degenerative joint disease (DJD) of hip    Gout    Hypertension     Current Outpatient Medications:    sulfamethoxazole-trimethoprim (BACTRIM DS) 800-160 MG tablet, Take 1 tablet by mouth 2 (two) times daily., Disp: 60 tablet, Rfl: 0   terbinafine (LAMISIL) 250 MG tablet, Take 1 tablet (250 mg total) by mouth daily., Disp: 30 tablet, Rfl: 0   colchicine 0.6 MG tablet, Take 1 tablet (0.6 mg total) by mouth daily., Disp: 30 tablet, Rfl: 0   doxycycline (VIBRA-TABS) 100 MG tablet, Take 1 tablet (100 mg total) by mouth 2 (two) times daily., Disp: 60 tablet, Rfl: 0   Erythromycin 2 % PADS, Apply 1 application topically daily., Disp: 60 each, Rfl: 0   losartan (COZAAR) 100 MG tablet, Take 1 tablet (100 mg total) by mouth daily., Disp: 90 tablet, Rfl: 3   PARoxetine (PAXIL-CR) 37.5 MG 24 hr tablet, TAKE 1 TABLET(37.5 MG) BY MOUTH DAILY, Disp: 30 tablet, Rfl: 1  Social History   Tobacco Use  Smoking Status Never  Smokeless Tobacco Never    No Known Allergies Objective:  There were no vitals filed for this visit. There is no height or weight on file to calculate BMI. Constitutional Well developed. Well nourished.  Vascular Dorsalis pedis pulses palpable bilaterally. Posterior tibial pulses palpable bilaterally. Capillary refill normal to all digits.  No cyanosis or clubbing noted. Pedal hair growth normal.  Neurologic Normal  speech. Oriented to person, place, and time. Epicritic sensation to light touch grossly present bilaterally.  Dermatologic Improving superficial friction blister noted to digits 1 through 5 circumferentially to the distal tip likely due to previous dressing causing friction in between the toes and around the toes.  No further erythema noted up to the level of the metatarsophalangeal joint.  No streaking cellulitis noted.  No malodor present.  No purulent drainage noted.  Orthopedic: Normal joint ROM without pain or crepitus bilaterally. No visible deformities. No bony tenderness.   Radiographs: 3 views of skeletally mature left foot: No radiographic signs of osteomyelitic changes noted.  No soft tissue infection noted.  Hammertoe contractures noted.  No other bony abnormalities identified Assessment:   1. Friction blister of left lower extremity, initial encounter      Plan:  Patient was evaluated and treated and all questions answered.  Left 1 through 5 friction blister with underlying tinea pedis superinfection ~improving -I explained the patient the etiology of friction blister and various treatment options were discussed.   -At this time I will hold off on Betadine as patient's blister sites are very dry.  It is improving at a very slow rate. -I will broaden the antibiotics to Bactrim. -Lamisil was dispensed to help with the fungal component to this. -Erythromycin pads were dispensed for interdigital treatment.  No follow-ups on file.

## 2021-10-16 ENCOUNTER — Other Ambulatory Visit: Payer: Self-pay

## 2021-10-16 ENCOUNTER — Ambulatory Visit: Payer: 59 | Admitting: Podiatry

## 2021-10-16 DIAGNOSIS — S80822A Blister (nonthermal), left lower leg, initial encounter: Secondary | ICD-10-CM

## 2021-10-16 DIAGNOSIS — B999 Unspecified infectious disease: Secondary | ICD-10-CM | POA: Diagnosis not present

## 2021-10-16 MED ORDER — ERYTHROMYCIN 2 % EX PADS: 1.0000 "application " | MEDICATED_PAD | Freq: Every day | CUTANEOUS | 3 refills | Status: AC

## 2021-10-16 MED ORDER — SULFAMETHOXAZOLE-TRIMETHOPRIM 800-160 MG PO TABS: 1.0000 | ORAL_TABLET | Freq: Two times a day (BID) | ORAL | 0 refills | Status: AC

## 2021-10-16 MED ORDER — TERBINAFINE HCL 250 MG PO TABS: 250.0000 mg | ORAL_TABLET | Freq: Every day | ORAL | 0 refills | Status: AC

## 2021-10-18 NOTE — Progress Notes (Signed)
°  Subjective:  Patient ID: John Singh, male    DOB: [birth year],  MRN: [id number]  Chief Complaint  Patient presents with   Toe Pain    Friction blisters     50 y.o. male presents with the above complaint.  Patient presents with new complaint of left 1 through 5 friction blister.  He states is doing better.  The Betadine helps considerably.  He has been doing in between padding with Betadine now.   Review of Systems: Negative except as noted in the HPI. Denies N/V/F/Ch.  Past Medical History:  Diagnosis Date   Degenerative joint disease (DJD) of hip    Gout    Hypertension     Current Outpatient Medications:    sulfamethoxazole-trimethoprim (BACTRIM DS) 800-160 MG tablet, Take 1 tablet by mouth 2 (two) times daily., Disp: 60 tablet, Rfl: 0   colchicine 0.6 MG tablet, Take 1 tablet (0.6 mg total) by mouth daily., Disp: 30 tablet, Rfl: 0   doxycycline (VIBRA-TABS) 100 MG tablet, Take 1 tablet (100 mg total) by mouth 2 (two) times daily., Disp: 60 tablet, Rfl: 0   Erythromycin 2 % PADS, Apply 1 application topically daily., Disp: 60 each, Rfl: 3   losartan (COZAAR) 100 MG tablet, Take 1 tablet (100 mg total) by mouth daily., Disp: 90 tablet, Rfl: 3   PARoxetine (PAXIL-CR) 37.5 MG 24 hr tablet, TAKE 1 TABLET(37.5 MG) BY MOUTH DAILY, Disp: 30 tablet, Rfl: 1   sulfamethoxazole-trimethoprim (BACTRIM DS) 800-160 MG tablet, Take 1 tablet by mouth 2 (two) times daily., Disp: 60 tablet, Rfl: 0   terbinafine (LAMISIL) 250 MG tablet, Take 1 tablet (250 mg total) by mouth daily., Disp: 30 tablet, Rfl: 0  Social History   Tobacco Use  Smoking Status Never  Smokeless Tobacco Never    No Known Allergies Objective:  There were no vitals filed for this visit. There is no height or weight on file to calculate BMI. Constitutional Well developed. Well nourished.  Vascular Dorsalis pedis pulses palpable bilaterally. Posterior tibial pulses palpable bilaterally. Capillary refill normal to all  digits.  No cyanosis or clubbing noted. Pedal hair growth normal.  Neurologic Normal speech. Oriented to person, place, and time. Epicritic sensation to light touch grossly present bilaterally.  Dermatologic Improving superficial friction blister noted to digits 1 through 5 circumferentially to the distal tip likely due to previous dressing causing friction in between the toes and around the toes.  No further erythema noted up to the level of the metatarsophalangeal joint.  No streaking cellulitis noted.  No malodor present.  No purulent drainage noted.  Orthopedic: Normal joint ROM without pain or crepitus bilaterally. No visible deformities. No bony tenderness.   Radiographs: 3 views of skeletally mature left foot: No radiographic signs of osteomyelitic changes noted.  No soft tissue infection noted.  Hammertoe contractures noted.  No other bony abnormalities identified Assessment:   1. Friction blister of left lower extremity, initial encounter   2. Superinfection       Plan:  Patient was evaluated and treated and all questions answered.  Left 1 through 5 friction blister with underlying tinea pedis superinfection ~improving -I explained the patient the etiology of friction blister and various treatment options were discussed.   -We will continue to hold off Betadine for now.  It is improving considerably. -Continue Bactrim -Continue Lamisil -Continue erythromycin pads until resolve meant.  No follow-ups on file.

## 2021-11-06 ENCOUNTER — Ambulatory Visit: Payer: 59 | Admitting: Podiatry

## 2021-11-06 ENCOUNTER — Other Ambulatory Visit: Payer: Self-pay

## 2021-11-06 DIAGNOSIS — S80822A Blister (nonthermal), left lower leg, initial encounter: Secondary | ICD-10-CM | POA: Diagnosis not present

## 2021-11-06 DIAGNOSIS — B999 Unspecified infectious disease: Secondary | ICD-10-CM

## 2021-11-06 NOTE — Progress Notes (Signed)
°  Subjective:  Patient ID: John Singh, male    DOB: [birth year],  MRN: [id number]  Chief Complaint  Patient presents with   Toe Pain    No new concerns     51 y.o. male presents with the above complaint.  Patient presents with new complaint of left 1 through 5 friction blister.  He is doing a lot better.  Bactrim Lamisil and erythromycin pads has helped considerably.   Review of Systems: Negative except as noted in the HPI. Denies N/V/F/Ch.  Past Medical History:  Diagnosis Date   Degenerative joint disease (DJD) of hip    Gout    Hypertension     Current Outpatient Medications:    colchicine 0.6 MG tablet, Take 1 tablet (0.6 mg total) by mouth daily., Disp: 30 tablet, Rfl: 0   doxycycline (VIBRA-TABS) 100 MG tablet, Take 1 tablet (100 mg total) by mouth 2 (two) times daily., Disp: 60 tablet, Rfl: 0   Erythromycin 2 % PADS, Apply 1 application topically daily., Disp: 60 each, Rfl: 3   losartan (COZAAR) 100 MG tablet, Take 1 tablet (100 mg total) by mouth daily., Disp: 90 tablet, Rfl: 3   PARoxetine (PAXIL-CR) 37.5 MG 24 hr tablet, TAKE 1 TABLET(37.5 MG) BY MOUTH DAILY, Disp: 30 tablet, Rfl: 1   sulfamethoxazole-trimethoprim (BACTRIM DS) 800-160 MG tablet, Take 1 tablet by mouth 2 (two) times daily., Disp: 60 tablet, Rfl: 0   sulfamethoxazole-trimethoprim (BACTRIM DS) 800-160 MG tablet, Take 1 tablet by mouth 2 (two) times daily., Disp: 60 tablet, Rfl: 0   terbinafine (LAMISIL) 250 MG tablet, Take 1 tablet (250 mg total) by mouth daily., Disp: 30 tablet, Rfl: 0  Social History   Tobacco Use  Smoking Status Never  Smokeless Tobacco Never    No Known Allergies Objective:  There were no vitals filed for this visit. There is no height or weight on file to calculate BMI. Constitutional Well developed. Well nourished.  Vascular Dorsalis pedis pulses palpable bilaterally. Posterior tibial pulses palpable bilaterally. Capillary refill normal to all digits.  No cyanosis or  clubbing noted. Pedal hair growth normal.  Neurologic Normal speech. Oriented to person, place, and time. Epicritic sensation to light touch grossly present bilaterally.  Dermatologic Improving superficial friction blister noted to digits 1 through 5 circumferentially to the distal tip likely due to previous dressing causing friction in between the toes and around the toes.  No further erythema noted up to the level of the metatarsophalangeal joint.  No streaking cellulitis noted.  No malodor present.  No purulent drainage noted.  Orthopedic: Normal joint ROM without pain or crepitus bilaterally. No visible deformities. No bony tenderness.   Radiographs: 3 views of skeletally mature left foot: No radiographic signs of osteomyelitic changes noted.  No soft tissue infection noted.  Hammertoe contractures noted.  No other bony abnormalities identified Assessment:   1. Friction blister of left lower extremity, initial encounter   2. Superinfection        Plan:  Patient was evaluated and treated and all questions answered.  Left 1 through 5 friction blister with underlying tinea pedis superinfection ~95% resolved -I explained the patient the etiology of friction blister and various treatment options were discussed.  . -Continue Bactrim -Continue Lamisil -Continue erythromycin pads until resolve meant.  No follow-ups on file.

## 2021-12-18 ENCOUNTER — Ambulatory Visit: Payer: 59 | Admitting: Podiatry

## 2023-04-22 ENCOUNTER — Ambulatory Visit: Payer: BC Managed Care – PPO | Admitting: Podiatry

## 2023-04-22 DIAGNOSIS — L97522 Non-pressure chronic ulcer of other part of left foot with fat layer exposed: Secondary | ICD-10-CM | POA: Diagnosis not present

## 2023-04-22 MED ORDER — DOXYCYCLINE HYCLATE 100 MG PO TABS: 100.0000 mg | ORAL_TABLET | Freq: Two times a day (BID) | ORAL | 0 refills | Status: DC

## 2023-04-22 NOTE — Progress Notes (Signed)
  Subjective:  Patient ID: John Singh, male    DOB: 1970/12/06,  MRN: [id number]  Chief Complaint  Patient presents with   Toe Pain    Left second toe possible gout     52 y.o. male presents with the above complaint.  Patient presents with left second digit ulceration probing down to bone with some redness.  No purulent drainage noted.  Patient states started noticing it denies any other treatment has not seen and was prior to seeing me.  Would like to discuss treatment options   Review of Systems: Negative except as noted in the HPI. Denies N/V/F/Ch.  Past Medical History:  Diagnosis Date   Degenerative joint disease (DJD) of hip    Gout    Hypertension     Current Outpatient Medications:    doxycycline (VIBRA-TABS) 100 MG tablet, Take 1 tablet (100 mg total) by mouth 2 (two) times daily., Disp: 60 tablet, Rfl: 0   colchicine 0.6 MG tablet, Take 1 tablet (0.6 mg total) by mouth daily., Disp: 30 tablet, Rfl: 0   doxycycline (VIBRA-TABS) 100 MG tablet, Take 1 tablet (100 mg total) by mouth 2 (two) times daily., Disp: 60 tablet, Rfl: 0   Erythromycin 2 % PADS, Apply 1 application topically daily., Disp: 60 each, Rfl: 3   losartan (COZAAR) 100 MG tablet, Take 1 tablet (100 mg total) by mouth daily., Disp: 90 tablet, Rfl: 3   PARoxetine (PAXIL-CR) 37.5 MG 24 hr tablet, TAKE 1 TABLET(37.5 MG) BY MOUTH DAILY, Disp: 30 tablet, Rfl: 1   sulfamethoxazole-trimethoprim (BACTRIM DS) 800-160 MG tablet, Take 1 tablet by mouth 2 (two) times daily., Disp: 60 tablet, Rfl: 0   terbinafine (LAMISIL) 250 MG tablet, Take 1 tablet (250 mg total) by mouth daily., Disp: 30 tablet, Rfl: 0  Social History   Tobacco Use  Smoking Status Never  Smokeless Tobacco Never    No Known Allergies Objective:  There were no vitals filed for this visit. There is no height or weight on file to calculate BMI. Constitutional Well developed. Well nourished.  Vascular Dorsalis pedis pulses palpable  bilaterally. Posterior tibial pulses palpable bilaterally. Capillary refill normal to all digits.  No cyanosis or clubbing noted. Pedal hair growth normal.  Neurologic Normal speech. Oriented to person, place, and time. Epicritic sensation to light touch grossly present bilaterally.  Dermatologic Left second digit ulceration probing down to bone mild cellulitis.  No purulent drainage noted no malodor present.  Orthopedic: Normal joint ROM without pain or crepitus bilaterally. No visible deformities. No bony tenderness.   Radiographs: None Assessment:   1. Ulcer of toe, left, with fat layer exposed (HCC)    Plan:  Patient was evaluated and treated and all questions answered.  Left second digit ulceration probing down to bone mild cellulitis -All questions and concerns were discussed with the patient in extensive detail.  At this time patient does not have active purulent drainage therefore I believe patient would benefit from antibiotics doxycycline was sent to the pharmacy -I discussed with him to do Betadine wet-to-dry dressing -He is at high risk of losing the toe given the nature of the wound and the depth of the probe. -If it regresses patient will go to the emergency room I discussed this with patient and he states understanding  No follow-ups on file.

## 2023-05-06 ENCOUNTER — Ambulatory Visit: Payer: BC Managed Care – PPO | Admitting: Podiatry

## 2023-05-06 DIAGNOSIS — L97522 Non-pressure chronic ulcer of other part of left foot with fat layer exposed: Secondary | ICD-10-CM

## 2023-05-06 MED ORDER — DOXYCYCLINE HYCLATE 100 MG PO TABS: 100.0000 mg | ORAL_TABLET | Freq: Two times a day (BID) | ORAL | 0 refills | Status: DC

## 2023-05-06 NOTE — Progress Notes (Signed)
  Subjective:  Patient ID: John Singh, male    DOB: [birth year],  MRN: [id number]  Chief Complaint  Patient presents with   Foot Ulcer    52 y.o. male presents with the above complaint.  Patient presents with left second digit ulceration probing down to bone but with improvement.  He states doing better he did aggressive Betadine wet-to-dry dressing.  He has been taking his antibiotics denies any other acute complaints   Review of Systems: Negative except as noted in the HPI. Denies N/V/F/Ch.  Past Medical History:  Diagnosis Date   Degenerative joint disease (DJD) of hip    Gout    Hypertension     Current Outpatient Medications:    colchicine 0.6 MG tablet, Take 1 tablet (0.6 mg total) by mouth daily., Disp: 30 tablet, Rfl: 0   doxycycline (VIBRA-TABS) 100 MG tablet, Take 1 tablet (100 mg total) by mouth 2 (two) times daily., Disp: 60 tablet, Rfl: 0   doxycycline (VIBRA-TABS) 100 MG tablet, Take 1 tablet (100 mg total) by mouth 2 (two) times daily., Disp: 60 tablet, Rfl: 0   Erythromycin 2 % PADS, Apply 1 application topically daily., Disp: 60 each, Rfl: 3   losartan (COZAAR) 100 MG tablet, Take 1 tablet (100 mg total) by mouth daily., Disp: 90 tablet, Rfl: 3   PARoxetine (PAXIL-CR) 37.5 MG 24 hr tablet, TAKE 1 TABLET(37.5 MG) BY MOUTH DAILY, Disp: 30 tablet, Rfl: 1   sulfamethoxazole-trimethoprim (BACTRIM DS) 800-160 MG tablet, Take 1 tablet by mouth 2 (two) times daily., Disp: 60 tablet, Rfl: 0   terbinafine (LAMISIL) 250 MG tablet, Take 1 tablet (250 mg total) by mouth daily., Disp: 30 tablet, Rfl: 0  Social History   Tobacco Use  Smoking Status Never  Smokeless Tobacco Never    No Known Allergies Objective:  There were no vitals filed for this visit. There is no height or weight on file to calculate BMI. Constitutional Well developed. Well nourished.  Vascular Dorsalis pedis pulses palpable bilaterally. Posterior tibial pulses palpable bilaterally. Capillary refill  normal to all digits.  No cyanosis or clubbing noted. Pedal hair growth normal.  Neurologic Normal speech. Oriented to person, place, and time. Epicritic sensation to light touch grossly present bilaterally.  Dermatologic Left second digit ulceration probing down to bone without erythema no purulent drainage noted no malodor present.  Orthopedic: Normal joint ROM without pain or crepitus bilaterally. No visible deformities. No bony tenderness.   Radiographs: None Assessment:   No diagnosis found.  Plan:  Patient was evaluated and treated and all questions answered.  Left second digit ulceration probing down to bone without erythema -All questions and concerns were discussed with the patient in extensive detail.  The wound is improving slowly.  At this time we will continue salvage procedure -Continue Betadine wet-to-dry dressing -He is at high risk of losing the toe given the nature of the wound and the depth of the probe. -If it regresses patient will go to the emergency room I discussed this with patient and he states understanding  No follow-ups on file.

## 2023-05-29 ENCOUNTER — Ambulatory Visit: Payer: BC Managed Care – PPO | Admitting: Podiatry

## 2023-05-29 DIAGNOSIS — L97522 Non-pressure chronic ulcer of other part of left foot with fat layer exposed: Secondary | ICD-10-CM | POA: Diagnosis not present

## 2023-05-29 NOTE — Progress Notes (Signed)
  Subjective:  Patient ID: John Singh, male    DOB: 09/20/1971,  MRN: [id number]  Chief Complaint  Patient presents with   Wound Check    Left foot 2nd toe wound     52 y.o. male presents with the above complaint.  Patient presents with left second digit ulceration longer probing down.  Improving.  Aggressive Betadine wet-to-dry dressing has been doing it he is also been taking his antibiotics   Review of Systems: Negative except as noted in the HPI. Denies N/V/F/Ch.  Past Medical History:  Diagnosis Date   Degenerative joint disease (DJD) of hip    Gout    Hypertension     Current Outpatient Medications:    colchicine 0.6 MG tablet, Take 1 tablet (0.6 mg total) by mouth daily., Disp: 30 tablet, Rfl: 0   doxycycline (VIBRA-TABS) 100 MG tablet, Take 1 tablet (100 mg total) by mouth 2 (two) times daily., Disp: 60 tablet, Rfl: 0   doxycycline (VIBRA-TABS) 100 MG tablet, Take 1 tablet (100 mg total) by mouth 2 (two) times daily., Disp: 60 tablet, Rfl: 0   Erythromycin 2 % PADS, Apply 1 application topically daily., Disp: 60 each, Rfl: 3   losartan (COZAAR) 100 MG tablet, Take 1 tablet (100 mg total) by mouth daily., Disp: 90 tablet, Rfl: 3   PARoxetine (PAXIL-CR) 37.5 MG 24 hr tablet, TAKE 1 TABLET(37.5 MG) BY MOUTH DAILY, Disp: 30 tablet, Rfl: 1   sulfamethoxazole-trimethoprim (BACTRIM DS) 800-160 MG tablet, Take 1 tablet by mouth 2 (two) times daily., Disp: 60 tablet, Rfl: 0   terbinafine (LAMISIL) 250 MG tablet, Take 1 tablet (250 mg total) by mouth daily., Disp: 30 tablet, Rfl: 0  Social History   Tobacco Use  Smoking Status Never  Smokeless Tobacco Never    No Known Allergies Objective:  There were no vitals filed for this visit. There is no height or weight on file to calculate BMI. Constitutional Well developed. Well nourished.  Vascular Dorsalis pedis pulses palpable bilaterally. Posterior tibial pulses palpable bilaterally. Capillary refill normal to all digits.   No cyanosis or clubbing noted. Pedal hair growth normal.  Neurologic Normal speech. Oriented to person, place, and time. Epicritic sensation to light touch grossly present bilaterally.  Dermatologic Left second digit ulceration probing down to bone without erythema no purulent drainage noted no malodor present.  Orthopedic: Normal joint ROM without pain or crepitus bilaterally. No visible deformities. No bony tenderness.   Radiographs: None Assessment:   No diagnosis found.  Plan:  Patient was evaluated and treated and all questions answered.  Left second digit ulceration probing down to fat layer and improving -All questions and concerns were discussed with the patient in extensive detail.  The wound is improving slowly.  At this time we will continue salvage procedure -Continue Betadine wet-to-dry dressing -He is at high risk of losing the toe given the nature of the wound and the depth of the probe. -If it regresses patient will go to the emergency room I discussed this with patient and he states understanding  No follow-ups on file.

## 2023-07-10 ENCOUNTER — Ambulatory Visit: Payer: BC Managed Care – PPO | Admitting: Podiatry

## 2023-12-28 ENCOUNTER — Ambulatory Visit
Admission: EM | Admit: 2023-12-28 | Discharge: 2023-12-28 | Disposition: A | Discharge status: Home / Self Care | Payer: BC Managed Care – PPO | Attending: Internal Medicine | Admitting: Internal Medicine

## 2023-12-28 ENCOUNTER — Encounter: Payer: Self-pay | Admitting: Emergency Medicine

## 2023-12-28 DIAGNOSIS — I10 Essential (primary) hypertension: Secondary | ICD-10-CM

## 2023-12-28 DIAGNOSIS — F1010 Alcohol abuse, uncomplicated: Secondary | ICD-10-CM

## 2023-12-28 NOTE — Discharge Instructions (Addendum)
 Staff will call if your blood work is abnormal.  Avoid going longer periods of time without eating to prevent feeling shaky. Drink at least 64 ounces of water per day.  Your blood pressure was elevated in the clinic today. We need to bring your blood pressure down safely and slowly to a normal level with medicine. Take blood pressure medication once daily in the morning.  Purchase a blood pressure cuff and take your BP 3-4 times per week. Write these numbers down in a notebook and bring the notebook with you to your primary care doctor appointment.  I expect your numbers to remain elevated and slowly come down to "normal".   Lower the amount of salt in your diet to less than 1 gram of salt per day and increase exercise to naturally lower BP. Review information provided regarding high blood pressure.  Schedule an appointment with primary care provider for ongoing management of high blood pressure and for routine healthcare screenings.  Please go to the ER if you develop any severe symptoms such as chest pain, sudden shortness of breath, or one-sided weakness. I hope you feel better!

## 2023-12-28 NOTE — ED Provider Notes (Addendum)
 [name]    CSN: [id number] Arrival date & time: 12/28/23  1310      History   Chief Complaint Chief Complaint  Patient presents with   Hypertension    HPI John Singh is a 53 y.o. male.   Patient presents to urgent care for evaluation of high blood pressure.  His states his skin started to become red and splotchy on his hands, arms, face and he became shaky while at work today causing him to check his blood pressure.  Blood pressure was 170/116 at work and is currently 164/104.  History of hypertension, takes losartan 25 mg daily without missed doses.  Additionally reports history of prediabetes.  He was able to eat some cookies and yogurt at work which helped his shaky feeling significantly.  Currently reports shakiness has improved though he is now experiencing a mild frontal headache.  Denies vision changes, dizziness, syncope, chest pain, palpitations, extremity paresthesias, extremity weakness, tinnitus, neck pain, and recent fever/chills.  He had not eaten when symptoms started.  Additionally, he admits to drinking 1/2 pint of liquor last night.  Patient used to drink alcohol daily, he does not drink daily any longer and states he does not get shaky or confused/with hallucinations when he stops drinking alcohol abruptly.  He has not attempted use of any over-the-counter medications to help with symptoms PTA   Hypertension    Past Medical History:  Diagnosis Date   Degenerative joint disease (DJD) of hip    Gout    Hypertension     Patient Active Problem List   Diagnosis Date Noted   Medication side effect 12/30/2019   Alcoholic peripheral neuropathy (HCC) 03/21/2019   Hammer toe of right foot 02/16/2019   Pre-ulcerative corn or callous 02/16/2019   Elevated LDL cholesterol level 06/14/2018   Encounter for health maintenance examination with abnormal findings 05/14/2018   History of avascular necrosis of capital femoral epiphysis 05/14/2018   Alcohol  use 05/14/2018   Depression, recurrent (HCC) 05/14/2018   Gout of knee 04/29/2017   Elevated LFTs 04/29/2017   Essential hypertension 02/28/2017   Left knee pain 02/14/2014   Left hip pain 02/14/2014    Past Surgical History:  Procedure Laterality Date   JOINT REPLACEMENT     TOTAL HIP ARTHROPLASTY         Home Medications    Prior to Admission medications   Medication Sig Start Date End Date Taking? Authorizing Provider  colchicine 0.6 MG tablet Take 1 tablet (0.6 mg total) by mouth daily. 12/30/19   Mliss Sax, MD  doxycycline (VIBRA-TABS) 100 MG tablet Take 1 tablet (100 mg total) by mouth 2 (two) times daily. Patient not taking: Reported on 12/28/2023 04/22/23   Candelaria Stagers, DPM  doxycycline (VIBRA-TABS) 100 MG tablet Take 1 tablet (100 mg total) by mouth 2 (two) times daily. Patient not taking: Reported on 12/28/2023 05/06/23   Candelaria Stagers, DPM  Erythromycin 2 % PADS Apply 1 application topically daily. 10/16/21   Candelaria Stagers, DPM  losartan (COZAAR) 100 MG tablet Take 1 tablet (100 mg total) by mouth daily. 01/25/20   Mliss Sax, MD  PARoxetine (PAXIL-CR) 37.5 MG 24 hr tablet TAKE 1 TABLET(37.5 MG) BY MOUTH DAILY 07/12/21   Mliss Sax, MD  sulfamethoxazole-trimethoprim (BACTRIM DS) 800-160 MG tablet Take 1 tablet by mouth 2 (two) times daily. 09/25/21   Candelaria Stagers, DPM  terbinafine (LAMISIL) 250 MG tablet Take 1 tablet (250 mg  total) by mouth daily. 10/16/21   Candelaria Stagers, DPM    Family History Family History  Problem Relation Age of Onset   Heart disease Mother    Alcohol abuse Father    Alcohol abuse Brother    Drug abuse Brother     Social History Social History   Tobacco Use   Smoking status: Never   Smokeless tobacco: Never  Substance Use Topics   Alcohol use: Yes    Comment: occassional   Drug use: Yes    Types: Marijuana     Allergies   Patient has no known allergies.   Review of Systems Review of  Systems Per HPI  Physical Exam Triage Vital Signs ED Triage Vitals [12/28/23 1542]  Encounter Vitals Group     BP (!) 164/104     Systolic BP Percentile      Diastolic BP Percentile      Pulse Rate 87     Resp 17     Temp 98.2 F (36.8 C)     Temp Source Oral     SpO2 97 %     Weight      Height      Head Circumference      Peak Flow      Pain Score 0     Pain Loc      Pain Education      Exclude from Growth Chart    No data found.  Updated Vital Signs BP (!) 164/104 (BP Location: Right Arm)   Pulse 87   Temp 98.2 F (36.8 C) (Oral)   Resp 17   SpO2 97%   Visual Acuity Right Eye Distance:   Left Eye Distance:   Bilateral Distance:    Right Eye Near:   Left Eye Near:    Bilateral Near:     Physical Exam Vitals and nursing note reviewed.  Constitutional:      Appearance: He is not ill-appearing or toxic-appearing.  HENT:     Head: Normocephalic and atraumatic.     Right Ear: Hearing and external ear normal.     Left Ear: Hearing and external ear normal.     Nose: Nose normal.     Mouth/Throat:     Lips: Pink.  Eyes:     General: Lids are normal. Vision grossly intact. Gaze aligned appropriately.     Extraocular Movements: Extraocular movements intact.     Conjunctiva/sclera: Conjunctivae normal.  Cardiovascular:     Rate and Rhythm: Normal rate and regular rhythm.     Heart sounds: Normal heart sounds, S1 normal and S2 normal.  Pulmonary:     Effort: Pulmonary effort is normal. No respiratory distress.     Breath sounds: Normal breath sounds and air entry.  Musculoskeletal:     Cervical back: Neck supple.  Skin:    General: Skin is warm and dry.     Capillary Refill: Capillary refill takes less than 2 seconds.     Findings: No rash.  Neurological:     General: No focal deficit present.     Mental Status: He is alert and oriented to person, place, and time. Mental status is at baseline.     GCS: GCS eye subscore is 4. GCS verbal subscore is 5.  GCS motor subscore is 6.     Cranial Nerves: Cranial nerves 2-12 are intact. No cranial nerve deficit, dysarthria or facial asymmetry.     Sensory: Sensation is intact.  Motor: Motor function is intact. No weakness, tremor, abnormal muscle tone or pronator drift.     Coordination: Coordination is intact. Romberg sign negative. Coordination normal. Finger-Nose-Finger Test normal.     Gait: Gait is intact. Gait normal.     Comments: Strength and sensation intact to bilateral upper and lower extremities (5/5). Moves all 4 extremities with normal coordination voluntarily. Non-focal neuro exam.   Psychiatric:        Mood and Affect: Mood normal.        Speech: Speech normal.        Behavior: Behavior normal.        Thought Content: Thought content normal.        Judgment: Judgment normal.      UC Treatments / Results  Labs (all labs ordered are listed, but only abnormal results are displayed) Labs Reviewed  CBC  COMPREHENSIVE METABOLIC PANEL    EKG   Radiology No results found.  Procedures Procedures (including critical care time)  Medications Ordered in UC Medications - No data to display  Initial Impression / Assessment and Plan / UC Course  I have reviewed the triage vital signs and the nursing notes.  Pertinent labs & imaging results that were available during my care of the patient were reviewed by me and considered in my medical decision making (see chart for details).   1.  Essential hypertension, excessive drinking of alcohol Elevated blood pressure reading likely secondary to dehydration and alcohol use last night.  No red flag signs or symptoms indicating need for further workup or evaluation in the emergency department setting. Neurologic exam is intact without focal deficit. Encouraged to continue using losartan daily without missed doses.   Discussed use of home blood pressure cuff, dietary, and lifestyle changes to reduce blood pressure naturally. Encouraged  follow-up with primary care provider to discuss elevated blood pressure readings further and for medication management.  No current signs of alcohol withdrawal. No tremor on exam.  CBC and CMP pending to evaluate for electrolyte abnormality/organ dysfunction.  Staff will call if abnormal.  Counseled patient on potential for adverse effects with medications prescribed/recommended today, strict ER and return-to-clinic precautions discussed, patient verbalized understanding.    Final Clinical Impressions(s) / UC Diagnoses   Final diagnoses:  Essential hypertension  Excessive drinking alcohol     Discharge Instructions      Staff will call if your blood work is abnormal.  Avoid going longer periods of time without eating to prevent feeling shaky. Drink at least 64 ounces of water per day.  Your blood pressure was elevated in the clinic today. We need to bring your blood pressure down safely and slowly to a normal level with medicine. Take blood pressure medication once daily in the morning.  Purchase a blood pressure cuff and take your BP 3-4 times per week. Write these numbers down in a notebook and bring the notebook with you to your primary care doctor appointment.  I expect your numbers to remain elevated and slowly come down to "normal".   Lower the amount of salt in your diet to less than 1 gram of salt per day and increase exercise to naturally lower BP. Review information provided regarding high blood pressure.  Schedule an appointment with primary care provider for ongoing management of high blood pressure and for routine healthcare screenings.  Please go to the ER if you develop any severe symptoms such as chest pain, sudden shortness of breath, or one-sided weakness. I hope  you feel better!      ED Prescriptions   None    PDMP not reviewed this encounter.   Carlisle Beers, FNP 12/28/23 1628    Carlisle Beers, FNP 12/28/23 [phone]

## 2023-12-28 NOTE — ED Triage Notes (Signed)
 Pt was at work today and was shaking and skin red looking. They took his BP and it was elevated.

## 2023-12-29 LAB — COMPREHENSIVE METABOLIC PANEL
ALT: 21 [IU]/L (ref 0–44)
AST: 26 [IU]/L (ref 0–40)
Albumin: 4.4 g/dL (ref 3.8–4.9)
Alkaline Phosphatase: 112 [IU]/L (ref 44–121)
BUN/Creatinine Ratio: 9 (ref 9–20)
BUN: 10 mg/dL (ref 6–24)
Bilirubin Total: 0.9 mg/dL (ref 0.0–1.2)
CO2: 20 mmol/L (ref 20–29)
Calcium: 9.7 mg/dL (ref 8.7–10.2)
Chloride: 104 mmol/L (ref 96–106)
Creatinine, Ser: 1.12 mg/dL (ref 0.76–1.27)
Globulin, Total: 2.9 g/dL (ref 1.5–4.5)
Glucose: 95 mg/dL (ref 70–99)
Potassium: 4.5 mmol/L (ref 3.5–5.2)
Sodium: 141 mmol/L (ref 134–144)
Total Protein: 7.3 g/dL (ref 6.0–8.5)
eGFR: 79 mL/min/{1.73_m2} (ref >59)

## 2023-12-29 LAB — CBC
Hematocrit: 49.6 % (ref 37.5–51.0)
Hemoglobin: 16.9 g/dL (ref 13.0–17.7)
MCH: 32.1 pg (ref 26.6–33.0)
MCHC: 34.1 g/dL (ref 31.5–35.7)
MCV: 94 fL (ref 79–97)
Platelets: 284 10*3/uL (ref 150–450)
RBC: 5.27 x10E6/uL (ref 4.14–5.80)
RDW: 13.7 % (ref 11.6–15.4)
WBC: 6.3 10*3/uL (ref 3.4–10.8)

## 2024-01-04 ENCOUNTER — Ambulatory Visit: Admitting: Podiatry

## 2024-01-04 DIAGNOSIS — L97511 Non-pressure chronic ulcer of other part of right foot limited to breakdown of skin: Secondary | ICD-10-CM | POA: Diagnosis not present

## 2024-01-04 DIAGNOSIS — M2031 Hallux varus (acquired), right foot: Secondary | ICD-10-CM | POA: Diagnosis not present

## 2024-01-04 DIAGNOSIS — S90421A Blister (nonthermal), right great toe, initial encounter: Secondary | ICD-10-CM

## 2024-01-04 MED ORDER — DOXYCYCLINE HYCLATE 100 MG PO CAPS: 100.0000 mg | ORAL_CAPSULE | Freq: Two times a day (BID) | ORAL | 0 refills | Status: AC

## 2024-01-04 NOTE — Progress Notes (Unsigned)
 Chief Complaint  Patient presents with   Toe Pain    Pt stated that he bought some new boots and he wore them and he stated that it rubbed a blister on his right big toe    HPI: 53 y.o. male presents today with concern of a ruptured blister on the dorsal aspect of the right great toe.  This is a patient of Dr. Allena Katz, but the patient states he could not wait until he could obtain appointment with Dr. Allena Katz.  He has a previous history of ulcer on the left foot.  Patient states that he purchased a new pair of boots and this rubbed against the great toe.  He states it happened 1 week ago.  He has a previous second toe amputation on the right foot.  Patient is not diabetic.  Notes that he does have peripheral neuropathy but states this is due to alcoholism.  Past Medical History:  Diagnosis Date   Degenerative joint disease (DJD) of hip    Gout    Hypertension     Past Surgical History:  Procedure Laterality Date   JOINT REPLACEMENT     TOTAL HIP ARTHROPLASTY     No Known Allergies   Physical Exam: There are palpable pedal pulses to the right foot.  There is localized edema and erythema and calor to the right hallux.  There is a partial-thickness ulceration to the dorsal aspect of the right first IPJ area measuring approximately 0.9 x 0.8 x 0.1 cm.  The base is granular.  No purulence or gangrenous changes are noted.  No malodor is noted.  No exposed bone or tendon is seen.  There is rigid contracture of the hallux at the IPJ on the right.  Previous right second toe amputation noted.  There is no pain to the area secondary to neuropathy  Assessment/Plan of Care: 1. Chronic ulcer of great toe of right foot, limited to breakdown of skin (HCC)   2. Blister (nonthermal), right great toe, initial encounter   3. Hallux malleus of right foot      Meds ordered this encounter  Medications   doxycycline (VIBRAMYCIN) 100 MG capsule    Sig: Take 1 capsule (100 mg total) by mouth 2 (two) times  daily for 10 days.    Dispense:  20 capsule    Refill:  0   SURGICAL BOOT  Discussed clinical findings with patient today.  I will start the patient on doxycycline 100 mg twice daily x 10 days.  The ulceration did not require debridement today.  This was dressed with Betadine solution and a dry sterile dressing.  He was instructed to perform daily dressing changes using the same items as today.  He can perform Epsom salt soaks once a day for 10 to 15 minutes in lukewarm water to help cleanse the area prior to dressing changes.  Patient to DC the shoes/boots that caused the blister.  He was fitted for a surgical shoe so that there is no pressure on the dorsal hallux.  Follow-up in 1 to 2 weeks with Dr. Allena Katz.   Clerance Lav, DPM, FACFAS [facility]     [address], [place] [address]  Office [phone]  Fax [phone]

## 2024-01-13 ENCOUNTER — Ambulatory Visit (INDEPENDENT_AMBULATORY_CARE_PROVIDER_SITE_OTHER)

## 2024-01-13 ENCOUNTER — Ambulatory Visit: Admitting: Podiatry

## 2024-01-13 DIAGNOSIS — M2031 Hallux varus (acquired), right foot: Secondary | ICD-10-CM

## 2024-01-13 DIAGNOSIS — L97511 Non-pressure chronic ulcer of other part of right foot limited to breakdown of skin: Secondary | ICD-10-CM

## 2024-01-13 DIAGNOSIS — M2041 Other hammer toe(s) (acquired), right foot: Secondary | ICD-10-CM | POA: Diagnosis not present

## 2024-01-13 NOTE — Progress Notes (Signed)
 Subjective:  Patient ID: John Singh, male    DOB: Jun 20, 1971,  MRN: [id number]  Chief Complaint  Patient presents with   Foot Ulcer    53 y.o. male presents with the above complaint.  Patient presents with complaint of right hallux contracture as well as right contracture of third fourth and fifth digit.  Patient states that it is painful is progressive gotten worse wanted to get it evaluated has not seen and was prior to seeing me.  He started get a little bit sore especially on the big toe.  His last A1c from diabetes is evaluating for 5.4.  He denies any other acute complaints he is failed conservative care including shoe gear modification padding protecting offloading he would like to discuss surgical options at this time   Review of Systems: Negative except as noted in the HPI. Denies N/V/F/Ch.  Past Medical History:  Diagnosis Date   Degenerative joint disease (DJD) of hip    Gout    Hypertension     Current Outpatient Medications:    colchicine 0.6 MG tablet, Take 1 tablet (0.6 mg total) by mouth daily., Disp: 30 tablet, Rfl: 0   Erythromycin 2 % PADS, Apply 1 application topically daily., Disp: 60 each, Rfl: 3   losartan (COZAAR) 100 MG tablet, Take 1 tablet (100 mg total) by mouth daily., Disp: 90 tablet, Rfl: 3   PARoxetine (PAXIL-CR) 37.5 MG 24 hr tablet, TAKE 1 TABLET(37.5 MG) BY MOUTH DAILY, Disp: 30 tablet, Rfl: 1   terbinafine (LAMISIL) 250 MG tablet, Take 1 tablet (250 mg total) by mouth daily., Disp: 30 tablet, Rfl: 0  Social History   Tobacco Use  Smoking Status Never  Smokeless Tobacco Never    No Known Allergies Objective:  There were no vitals filed for this visit. There is no height or weight on file to calculate BMI. Constitutional Well developed. Well nourished.  Vascular Dorsalis pedis pulses palpable bilaterally. Posterior tibial pulses palpable bilaterally. Capillary refill normal to all digits.  No cyanosis or clubbing noted. Pedal hair  growth normal.  Neurologic Normal speech. Oriented to person, place, and time. Epicritic sensation to light touch grossly present bilaterally.  Dermatologic Nails well groomed and normal in appearance. No open wounds. No skin lesions.  Orthopedic: Hallux malleus with IPJ contracture with underlying arthritis clinically appreciated.  Pain with range of motion of the joint.  Toe contracture noted of the right third fourth and fifth digit semiflexible in nature.  Reducible deformity.  Pain on palpation   Radiographs:  3 views of skeletally mature adult right foot: Arthritis noted to the right hallux IPJ with chronic toe deformity.  Previous interval amputation of the second toe noted.  Hammertoe contracture of third fourth and fifth digit noted midfoot arthritis noted plantar heel spurring noted Assessment:   1. Hammertoe of right foot   2. Hallux malleus of right foot    Plan:  Patient was evaluated and treated and all questions answered.  Right hallux malleus with underlying arthritis and right third fourth and fifth digit toe contracture -All questions and concerns were discussed with the patient in extensive detail given the presence of contracture of the hallux as well as third fourth and fifth digit patient will benefit from surgical intervention I discussed right hallux IPJ fusion with flexor tenotomy of the third fourth and fifth digit.  I discussed my preoperative intra postop plan with the patient extensive detail he states understand like to proceed with surgery.  He is a  high risk of undergoing further amputation if not resolved. -Informed surgical risk consent was reviewed and read aloud to the patient.  I reviewed the films.  I have discussed my findings with the patient in great detail.  I have discussed all risks including but not limited to infection, stiffness, scarring, limp, disability, deformity, damage to blood vessels and nerves, numbness, poor healing, need for braces,  arthritis, chronic pain, amputation, death.  All benefits and realistic expectations discussed in great detail.  I have made no promises as to the outcome.  I have provided realistic expectations.  I have offered the patient a 2nd opinion, which they have declined and assured me they preferred to proceed despite the risks   No follow-ups on file.

## 2024-01-18 ENCOUNTER — Telehealth: Payer: Self-pay | Admitting: Urology

## 2024-01-18 NOTE — Telephone Encounter (Signed)
 LM for pt to call back when she is ready to schedule sx with Dr. Allena Katz.

## 2024-01-25 ENCOUNTER — Telehealth: Payer: Self-pay | Admitting: Podiatry

## 2024-01-25 NOTE — Telephone Encounter (Signed)
 Patient called and asked for his prescription for doxycycline to be sent in to the [facility] in [place] on [address]. Thank you.

## 2024-01-26 MED ORDER — DOXYCYCLINE HYCLATE 100 MG PO TABS: 100.0000 mg | ORAL_TABLET | Freq: Two times a day (BID) | ORAL | 0 refills | Status: AC

## 2024-01-27 NOTE — Telephone Encounter (Signed)
 Called and let patient know that his prescription was sent in to the pharmacy.

## 2024-02-08 ENCOUNTER — Telehealth: Payer: Self-pay | Admitting: Podiatry

## 2024-02-08 NOTE — Telephone Encounter (Signed)
 DOS: 02/29/24  (RT) ARTHRODESIS INTERPHAL JOINT  (RT) TENONTOTOMY  SINGLE 3RF,4TH &5TH   EFFECTIVE DATE: 02/02/2023   DEDUCTIBLE:  $600.00  REMAINING:  $600.00  OOP:  $1,600.00  REMAINING:  $3,200.00  CO INSURANCE : 20%  PER ANGEL S OF [employer] NO PRIOR AUTH IS REQ FOR CPT CODES [id number]  REF [id number]

## 2024-02-29 ENCOUNTER — Other Ambulatory Visit: Payer: Self-pay | Admitting: Podiatry

## 2024-02-29 DIAGNOSIS — M2041 Other hammer toe(s) (acquired), right foot: Secondary | ICD-10-CM | POA: Diagnosis not present

## 2024-02-29 DIAGNOSIS — M2031 Hallux varus (acquired), right foot: Secondary | ICD-10-CM | POA: Diagnosis not present

## 2024-02-29 MED ORDER — OXYCODONE-ACETAMINOPHEN 5-325 MG PO TABS: 1.0000 | ORAL_TABLET | ORAL | 0 refills | Status: AC | PRN

## 2024-02-29 MED ORDER — IBUPROFEN 800 MG PO TABS: 800.0000 mg | ORAL_TABLET | Freq: Four times a day (QID) | ORAL | 1 refills | Status: AC | PRN

## 2024-03-09 ENCOUNTER — Ambulatory Visit (INDEPENDENT_AMBULATORY_CARE_PROVIDER_SITE_OTHER)

## 2024-03-09 ENCOUNTER — Ambulatory Visit: Admitting: Podiatry

## 2024-03-09 DIAGNOSIS — L97522 Non-pressure chronic ulcer of other part of left foot with fat layer exposed: Secondary | ICD-10-CM

## 2024-03-09 DIAGNOSIS — M2041 Other hammer toe(s) (acquired), right foot: Secondary | ICD-10-CM

## 2024-03-09 DIAGNOSIS — Z9889 Other specified postprocedural states: Secondary | ICD-10-CM

## 2024-03-09 MED ORDER — DOXYCYCLINE HYCLATE 100 MG PO TABS: 100.0000 mg | ORAL_TABLET | Freq: Two times a day (BID) | ORAL | 0 refills | Status: AC

## 2024-03-09 NOTE — Progress Notes (Signed)
 Subjective:  Patient ID: John Singh, male    DOB: 12/29/1970,  MRN: [id number]  Chief Complaint  Patient presents with   Routine Post Op    DOS 02/29/24 RT 3RD -5TH FLEXOR TENOTOMY /RT HALLUX INTERPHAL JOINT W/ FIXATION    DOS: 02/29/2024 Procedure: Hallux interphalangeal joint fusion with right 3rd, 4th and 5th flexor tenotomy  53 y.o. male returns for post-op check.  Patient states is doing okay no acute complaints.  Bandages clean dry and intact no pain on the right foot.  He has a new complaint of left submetatarsal 5 ulceration with fat layer exposed.  He states he just came out of nowhere wanted to discuss treatment options for  Review of Systems: Negative except as noted in the HPI. Denies N/V/F/Ch.  Past Medical History:  Diagnosis Date   Degenerative joint disease (DJD) of hip    Gout    Hypertension     Current Outpatient Medications:    doxycycline  (VIBRA -TABS) 100 MG tablet, Take 1 tablet (100 mg total) by mouth 2 (two) times daily., Disp: 60 tablet, Rfl: 0   colchicine  0.6 MG tablet, Take 1 tablet (0.6 mg total) by mouth daily., Disp: 30 tablet, Rfl: 0   doxycycline  (VIBRA -TABS) 100 MG tablet, Take 1 tablet (100 mg total) by mouth 2 (two) times daily., Disp: 20 tablet, Rfl: 0   doxycycline  (VIBRA -TABS) 100 MG tablet, Take 1 tablet (100 mg total) by mouth 2 (two) times daily., Disp: 20 tablet, Rfl: 0   Erythromycin  2 % PADS, Apply 1 application topically daily., Disp: 60 each, Rfl: 3   ibuprofen  (ADVIL ) 800 MG tablet, Take 1 tablet (800 mg total) by mouth every 6 (six) hours as needed., Disp: 60 tablet, Rfl: 1   losartan  (COZAAR ) 100 MG tablet, Take 1 tablet (100 mg total) by mouth daily., Disp: 90 tablet, Rfl: 3   oxyCODONE -acetaminophen  (PERCOCET) 5-325 MG tablet, Take 1 tablet by mouth every 4 (four) hours as needed for severe pain (pain score 7-10)., Disp: 30 tablet, Rfl: 0   PARoxetine  (PAXIL -CR) 37.5 MG 24 hr tablet, TAKE 1 TABLET(37.5 MG) BY MOUTH DAILY, Disp: 30  tablet, Rfl: 1   terbinafine  (LAMISIL ) 250 MG tablet, Take 1 tablet (250 mg total) by mouth daily., Disp: 30 tablet, Rfl: 0  Social History   Tobacco Use  Smoking Status Never  Smokeless Tobacco Never    No Known Allergies Objective:  There were no vitals filed for this visit. There is no height or weight on file to calculate BMI. Constitutional Well developed. Well nourished.  Vascular Foot warm and well perfused. Capillary refill normal to all digits.   Neurologic Normal speech. Oriented to person, place, and time. Epicritic sensation to light touch grossly present bilaterally.  Dermatologic Skin healing well without signs of infection. Skin edges well coapted without signs of infection.  Orthopedic: Tenderness to palpation noted about the surgical site.   Radiographs: 3 views of skeletally mature the right foot: Good correction alignment noted no backing out of hardware noted.  Reduction of IPJ joint noted. Assessment:   1. Hammertoe of right foot   2. Chronic foot ulcer with fat layer exposed, left (HCC)   3. Status post foot surgery    Plan:  Patient was evaluated and treated and all questions answered.  S/p foot surgery right -Progressing as expected post-operatively. -XR: See above -WB Status: Weightbearing as tolerated to the heel in surgical shoe -Sutures: Intact.  No clinical signs of dehiscence noted no complication noted. -  Medications: None -Foot redressed.   Left submetatarsal 5 ulceration fat layer exposed - Given the presence of ulceration patient will benefit from Betadine wet-to-dry dressing and offloading of the left foot.  Urged him to wear surgical shoe on the left side.  He will do Betadine wet-to-dry dressing changes daily. -Patient is a high risk of undergoing partial fifth ray amputation if it regresses No follow-ups on file.

## 2024-03-23 ENCOUNTER — Ambulatory Visit (INDEPENDENT_AMBULATORY_CARE_PROVIDER_SITE_OTHER): Admitting: Podiatry

## 2024-03-23 DIAGNOSIS — L97522 Non-pressure chronic ulcer of other part of left foot with fat layer exposed: Secondary | ICD-10-CM | POA: Diagnosis not present

## 2024-03-23 NOTE — Progress Notes (Signed)
 Subjective:  Patient ID: John Singh, male    DOB: 20-Dec-1970,  MRN: [id number]  Chief Complaint  Patient presents with   Routine Post Op    RM 11 POV # 2 DOS 02/29/24 RT 3RD -5TH FLEXOR TENOTOMY /RT HALLUX INTERPHAL JOINT W/ FIXATION. Pt states no pain in right hallux, sometimes there is shooting pain in both feet. Pt would like left foot examined.    DOS: 02/29/2024 Procedure: Hallux interphalangeal joint fusion with right 3rd, 4th and 5th flexor tenotomy  53 y.o. male returns for post-op check.  Patient states is doing okay.  He is here to evaluate his surgical site as well as left submet 5 ulceration denies any other acute issues  Review of Systems: Negative except as noted in the HPI. Denies N/V/F/Ch.  Past Medical History:  Diagnosis Date   Degenerative joint disease (DJD) of hip    Gout    Hypertension     Current Outpatient Medications:    colchicine  0.6 MG tablet, Take 1 tablet (0.6 mg total) by mouth daily., Disp: 30 tablet, Rfl: 0   doxycycline  (VIBRA -TABS) 100 MG tablet, Take 1 tablet (100 mg total) by mouth 2 (two) times daily., Disp: 20 tablet, Rfl: 0   doxycycline  (VIBRA -TABS) 100 MG tablet, Take 1 tablet (100 mg total) by mouth 2 (two) times daily., Disp: 20 tablet, Rfl: 0   doxycycline  (VIBRA -TABS) 100 MG tablet, Take 1 tablet (100 mg total) by mouth 2 (two) times daily., Disp: 60 tablet, Rfl: 0   Erythromycin  2 % PADS, Apply 1 application topically daily., Disp: 60 each, Rfl: 3   ibuprofen  (ADVIL ) 800 MG tablet, Take 1 tablet (800 mg total) by mouth every 6 (six) hours as needed., Disp: 60 tablet, Rfl: 1   losartan  (COZAAR ) 100 MG tablet, Take 1 tablet (100 mg total) by mouth daily., Disp: 90 tablet, Rfl: 3   oxyCODONE -acetaminophen  (PERCOCET) 5-325 MG tablet, Take 1 tablet by mouth every 4 (four) hours as needed for severe pain (pain score 7-10)., Disp: 30 tablet, Rfl: 0   PARoxetine  (PAXIL -CR) 37.5 MG 24 hr tablet, TAKE 1 TABLET(37.5 MG) BY MOUTH DAILY, Disp: 30  tablet, Rfl: 1   terbinafine  (LAMISIL ) 250 MG tablet, Take 1 tablet (250 mg total) by mouth daily., Disp: 30 tablet, Rfl: 0  Social History   Tobacco Use  Smoking Status Never  Smokeless Tobacco Never    No Known Allergies Objective:  There were no vitals filed for this visit. There is no height or weight on file to calculate BMI. Constitutional Well developed. Well nourished.  Vascular Foot warm and well perfused. Capillary refill normal to all digits.   Neurologic Normal speech. Oriented to person, place, and time. Epicritic sensation to light touch grossly present bilaterally.  Dermatologic Skin completely epithelialized no complication noted reduction of pressure noted left submetatarsal 5  Orthopedic: No further tenderness to palpation noted about the surgical site.   Radiographs: 3 views of skeletally mature the right foot: Good correction alignment noted no backing out of hardware noted.  Reduction of IPJ joint noted. Assessment:   1. Chronic foot ulcer with fat layer exposed, left (HCC)     Plan:  Patient was evaluated and treated and all questions answered.  S/p foot surgery right - Clinically healed and officially discharged from my care. Always  Left submetatarsal 5 ulceration fat layer exposed~improving - Given the presence of ulceration patient will benefit from Betadine wet-to-dry dressing and offloading of the left foot.  Continue wearing cam  boot or surgical shoe for offloading.  Continue applying Betadine wet-to-dry dressing. -Patient is a high risk of undergoing partial fifth ray amputation if it regresses. No follow-ups on file.

## 2024-04-15 ENCOUNTER — Ambulatory Visit (INDEPENDENT_AMBULATORY_CARE_PROVIDER_SITE_OTHER): Admitting: Podiatry

## 2024-04-15 DIAGNOSIS — L97522 Non-pressure chronic ulcer of other part of left foot with fat layer exposed: Secondary | ICD-10-CM | POA: Diagnosis not present

## 2024-04-15 NOTE — Progress Notes (Unsigned)
  Subjective:  Patient ID: John Singh, male    DOB: 1971/06/27,  MRN: [id number]  Chief Complaint  Patient presents with   Foot Ulcer    DOS: 02/29/2024 Procedure: Hallux interphalangeal joint fusion with right 3rd, 4th and 5th flexor tenotomy  53 y.o. male returns for post-op check.  Patient states is doing okay.  He is here to evaluate his surgical site as well as left submet 5 ulceration denies any other acute issues  Review of Systems: Negative except as noted in the HPI. Denies N/V/F/Ch.  Past Medical History:  Diagnosis Date   Degenerative joint disease (DJD) of hip    Gout    Hypertension     Current Outpatient Medications:    colchicine  0.6 MG tablet, Take 1 tablet (0.6 mg total) by mouth daily., Disp: 30 tablet, Rfl: 0   doxycycline  (VIBRA -TABS) 100 MG tablet, Take 1 tablet (100 mg total) by mouth 2 (two) times daily., Disp: 20 tablet, Rfl: 0   doxycycline  (VIBRA -TABS) 100 MG tablet, Take 1 tablet (100 mg total) by mouth 2 (two) times daily., Disp: 20 tablet, Rfl: 0   doxycycline  (VIBRA -TABS) 100 MG tablet, Take 1 tablet (100 mg total) by mouth 2 (two) times daily., Disp: 60 tablet, Rfl: 0   Erythromycin  2 % PADS, Apply 1 application topically daily., Disp: 60 each, Rfl: 3   ibuprofen  (ADVIL ) 800 MG tablet, Take 1 tablet (800 mg total) by mouth every 6 (six) hours as needed., Disp: 60 tablet, Rfl: 1   losartan  (COZAAR ) 100 MG tablet, Take 1 tablet (100 mg total) by mouth daily., Disp: 90 tablet, Rfl: 3   oxyCODONE -acetaminophen  (PERCOCET) 5-325 MG tablet, Take 1 tablet by mouth every 4 (four) hours as needed for severe pain (pain score 7-10)., Disp: 30 tablet, Rfl: 0   PARoxetine  (PAXIL -CR) 37.5 MG 24 hr tablet, TAKE 1 TABLET(37.5 MG) BY MOUTH DAILY, Disp: 30 tablet, Rfl: 1   terbinafine  (LAMISIL ) 250 MG tablet, Take 1 tablet (250 mg total) by mouth daily., Disp: 30 tablet, Rfl: 0  Social History   Tobacco Use  Smoking Status Never  Smokeless Tobacco Never    No  Known Allergies Objective:  There were no vitals filed for this visit. There is no height or weight on file to calculate BMI. Constitutional Well developed. Well nourished.  Vascular Foot warm and well perfused. Capillary refill normal to all digits.   Neurologic Normal speech. Oriented to person, place, and time. Epicritic sensation to light touch grossly present bilaterally.  Dermatologic Skin completely epithelialized no complication noted reduction of pressure noted left submetatarsal 5  Orthopedic: No further tenderness to palpation noted about the surgical site.   Radiographs: 3 views of skeletally mature the right foot: Good correction alignment noted no backing out of hardware noted.  Reduction of IPJ joint noted. Assessment:   No diagnosis found.   Plan:  Patient was evaluated and treated and all questions answered.  S/p foot surgery right - Clinically healed and officially discharged from my care. Always  Left submetatarsal 5 ulceration fat layer exposed~improving - Given the presence of ulceration patient will benefit from Betadine wet-to-dry dressing and offloading of the left foot.  Continue wearing cam boot or surgical shoe for offloading.  Continue applying Betadine wet-to-dry dressing. -Will decide if patient needs surgical intervention.  Next visit. -Patient is a high risk of undergoing partial fifth ray amputation if it regresses. No follow-ups on file.

## 2024-05-20 ENCOUNTER — Ambulatory Visit (INDEPENDENT_AMBULATORY_CARE_PROVIDER_SITE_OTHER): Admitting: Podiatry

## 2024-05-20 DIAGNOSIS — Q666 Other congenital valgus deformities of feet: Secondary | ICD-10-CM

## 2024-05-20 DIAGNOSIS — L97522 Non-pressure chronic ulcer of other part of left foot with fat layer exposed: Secondary | ICD-10-CM

## 2024-05-20 NOTE — Progress Notes (Signed)
 Subjective:  Patient ID: John Singh, male    DOB: 12-06-1970,  MRN: [id number]  Chief Complaint  Patient presents with   Foot Ulcer    1 month follow-up ulcer on LT foot.  No pain, swelling    53 y.o. male presents with the above complaint.  Patient presents with left submetatarsal 5 ulceration with fat layer exposed.  Patient states that he is doing better has been doing iodine dressings on it denies any other acute complaints been managing it.  Review of Systems: Negative except as noted in the HPI. Denies N/V/F/Ch.  Past Medical History:  Diagnosis Date   Degenerative joint disease (DJD) of hip    Gout    Hypertension     Current Outpatient Medications:    colchicine  0.6 MG tablet, Take 1 tablet (0.6 mg total) by mouth daily., Disp: 30 tablet, Rfl: 0   doxycycline  (VIBRA -TABS) 100 MG tablet, Take 1 tablet (100 mg total) by mouth 2 (two) times daily., Disp: 20 tablet, Rfl: 0   doxycycline  (VIBRA -TABS) 100 MG tablet, Take 1 tablet (100 mg total) by mouth 2 (two) times daily., Disp: 20 tablet, Rfl: 0   doxycycline  (VIBRA -TABS) 100 MG tablet, Take 1 tablet (100 mg total) by mouth 2 (two) times daily., Disp: 60 tablet, Rfl: 0   Erythromycin  2 % PADS, Apply 1 application topically daily., Disp: 60 each, Rfl: 3   ibuprofen  (ADVIL ) 800 MG tablet, Take 1 tablet (800 mg total) by mouth every 6 (six) hours as needed., Disp: 60 tablet, Rfl: 1   losartan  (COZAAR ) 100 MG tablet, Take 1 tablet (100 mg total) by mouth daily., Disp: 90 tablet, Rfl: 3   oxyCODONE -acetaminophen  (PERCOCET) 5-325 MG tablet, Take 1 tablet by mouth every 4 (four) hours as needed for severe pain (pain score 7-10)., Disp: 30 tablet, Rfl: 0   PARoxetine  (PAXIL -CR) 37.5 MG 24 hr tablet, TAKE 1 TABLET(37.5 MG) BY MOUTH DAILY, Disp: 30 tablet, Rfl: 1   terbinafine  (LAMISIL ) 250 MG tablet, Take 1 tablet (250 mg total) by mouth daily., Disp: 30 tablet, Rfl: 0  Social History   Tobacco Use  Smoking Status Never  Smokeless  Tobacco Never    No Known Allergies Objective:  There were no vitals filed for this visit. There is no height or weight on file to calculate BMI. Constitutional Well developed. Well nourished.  Vascular Dorsalis pedis pulses palpable bilaterally. Posterior tibial pulses palpable bilaterally. Capillary refill normal to all digits.  No cyanosis or clubbing noted. Pedal hair growth normal.  Neurologic Normal speech. Oriented to person, place, and time. Epicritic sensation to light touch grossly present bilaterally.  Dermatologic Left second digit ulceration probing down to bone mild cellulitis.  No purulent drainage noted no malodor present.  Orthopedic: Normal joint ROM without pain or crepitus bilaterally. No visible deformities. No bony tenderness.   Radiographs: None Assessment:   1. Chronic foot ulcer with fat layer exposed, left (HCC)   2. Pes planovalgus     Plan:  Patient was evaluated and treated and all questions answered.  Left submetatarsal 5 ulceration with fat layer exposed improving - All questions or concerns were discussed with the patient extensive detail - Patient will continue Betadine wet-to-dry dressing - At this time the wound is regressing he will continue wearing surgical shoe as well - He is at high risk of undergoing amputation  Pes planovalgus -I explained to patient the etiology of pes planovalgus and relationship with Planter fasciitis and various treatment options were discussed.  Given patient foot structure in the setting of Planter fasciitis I believe patient will benefit from custom-made orthotics to help control the hindfoot motion support the arch of the foot and take the stress away from plantar fascial.  Patient agrees with the plan like to proceed with orthotics -Patient was casted for orthotics with offloading of bilateral submet 5   No follow-ups on file.

## 2024-06-17 ENCOUNTER — Ambulatory Visit: Admitting: Podiatry

## 2024-06-17 ENCOUNTER — Other Ambulatory Visit

## 2024-06-17 DIAGNOSIS — L97522 Non-pressure chronic ulcer of other part of left foot with fat layer exposed: Secondary | ICD-10-CM

## 2024-06-17 DIAGNOSIS — Q666 Other congenital valgus deformities of feet: Secondary | ICD-10-CM | POA: Diagnosis not present

## 2024-06-17 NOTE — Progress Notes (Signed)
 Subjective:  Patient ID: John Singh, male    DOB: 1970-11-17,  MRN: [id number]  Chief Complaint  Patient presents with   Foot Ulcer    Left foot ulcer follow up     53 y.o. male presents with the above complaint.  Patient presents with left submetatarsal 5 ulceration with fat layer exposed.  Patient states that he is doing better has been doing iodine dressings on it denies any other acute complaints been managing it.  Review of Systems: Negative except as noted in the HPI. Denies N/V/F/Ch.  Past Medical History:  Diagnosis Date   Degenerative joint disease (DJD) of hip    Gout    Hypertension     Current Outpatient Medications:    colchicine  0.6 MG tablet, Take 1 tablet (0.6 mg total) by mouth daily., Disp: 30 tablet, Rfl: 0   doxycycline  (VIBRA -TABS) 100 MG tablet, Take 1 tablet (100 mg total) by mouth 2 (two) times daily., Disp: 20 tablet, Rfl: 0   doxycycline  (VIBRA -TABS) 100 MG tablet, Take 1 tablet (100 mg total) by mouth 2 (two) times daily., Disp: 20 tablet, Rfl: 0   doxycycline  (VIBRA -TABS) 100 MG tablet, Take 1 tablet (100 mg total) by mouth 2 (two) times daily., Disp: 60 tablet, Rfl: 0   Erythromycin  2 % PADS, Apply 1 application topically daily., Disp: 60 each, Rfl: 3   ibuprofen  (ADVIL ) 800 MG tablet, Take 1 tablet (800 mg total) by mouth every 6 (six) hours as needed., Disp: 60 tablet, Rfl: 1   losartan  (COZAAR ) 100 MG tablet, Take 1 tablet (100 mg total) by mouth daily., Disp: 90 tablet, Rfl: 3   oxyCODONE -acetaminophen  (PERCOCET) 5-325 MG tablet, Take 1 tablet by mouth every 4 (four) hours as needed for severe pain (pain score 7-10)., Disp: 30 tablet, Rfl: 0   PARoxetine  (PAXIL -CR) 37.5 MG 24 hr tablet, TAKE 1 TABLET(37.5 MG) BY MOUTH DAILY, Disp: 30 tablet, Rfl: 1   terbinafine  (LAMISIL ) 250 MG tablet, Take 1 tablet (250 mg total) by mouth daily., Disp: 30 tablet, Rfl: 0  Social History   Tobacco Use  Smoking Status Never  Smokeless Tobacco Never    No  Known Allergies Objective:  There were no vitals filed for this visit. There is no height or weight on file to calculate BMI. Constitutional Well developed. Well nourished.  Vascular Dorsalis pedis pulses palpable bilaterally. Posterior tibial pulses palpable bilaterally. Capillary refill normal to all digits.  No cyanosis or clubbing noted. Pedal hair growth normal.  Neurologic Normal speech. Oriented to person, place, and time. Epicritic sensation to light touch grossly present bilaterally.  Dermatologic Left second digit ulceration probing down to bone mild cellulitis.  No purulent drainage noted no malodor present.  Orthopedic: Normal joint ROM without pain or crepitus bilaterally. No visible deformities. No bony tenderness.   Radiographs: None Assessment:   1. Chronic foot ulcer with fat layer exposed, left (HCC)   2. Pes planovalgus      Plan:  Patient was evaluated and treated and all questions answered.  Left submetatarsal 5 ulceration with fat layer exposed improving - All questions or concerns were discussed with the patient extensive detail - Clinically healed and officially discharged from my care if any foot and ankle issues arise in the future he will come back and see me.  Pes planovalgus -I explained to patient the etiology of pes planovalgus and relationship with Planter fasciitis and various treatment options were discussed.  Given patient foot structure in the setting of Planter  fasciitis I believe patient will benefit from custom-made orthotics to help control the hindfoot motion support the arch of the foot and take the stress away from plantar fascial.  Patient agrees with the plan like to proceed with orthotics -Patient was casted for orthotics with offloading of bilateral submet 5 Orthotics were dispensed they are functioning well.  No follow-ups on file.
